# Patient Record
Sex: Female | Born: 1965 | Race: White | Hispanic: Yes | Marital: Single | Smoking: Former smoker
Health system: Southern US, Community
[De-identification: ages and names within clinical notes are randomized; demographics above are authoritative.]

## PROBLEM LIST (undated history)

## (undated) DIAGNOSIS — F419 Anxiety disorder, unspecified: Secondary | ICD-10-CM

## (undated) DIAGNOSIS — Z8659 Personal history of other mental and behavioral disorders: Secondary | ICD-10-CM

## (undated) DIAGNOSIS — R51 Headache: Secondary | ICD-10-CM

## (undated) DIAGNOSIS — G8929 Other chronic pain: Secondary | ICD-10-CM

## (undated) DIAGNOSIS — R519 Headache, unspecified: Secondary | ICD-10-CM

## (undated) DIAGNOSIS — J45909 Unspecified asthma, uncomplicated: Secondary | ICD-10-CM

## (undated) HISTORY — PX: ABLATION: SHX5711

## (undated) HISTORY — DX: Unspecified asthma, uncomplicated: J45.909

---

## 2004-10-03 ENCOUNTER — Emergency Department (HOSPITAL_COMMUNITY): Admission: EM | Admit: 2004-10-03 | Discharge: 2004-10-03 | Payer: Self-pay | Admitting: Emergency Medicine

## 2006-02-26 ENCOUNTER — Emergency Department (HOSPITAL_COMMUNITY): Admission: EM | Admit: 2006-02-26 | Discharge: 2006-02-26 | Payer: Self-pay | Admitting: *Deleted

## 2006-03-11 ENCOUNTER — Emergency Department (HOSPITAL_COMMUNITY): Admission: EM | Admit: 2006-03-11 | Discharge: 2006-03-11 | Payer: Self-pay | Admitting: Emergency Medicine

## 2015-03-17 ENCOUNTER — Emergency Department (HOSPITAL_COMMUNITY): Payer: Medicaid Other

## 2015-03-17 ENCOUNTER — Emergency Department (HOSPITAL_COMMUNITY)
Admission: EM | Admit: 2015-03-17 | Discharge: 2015-03-17 | Disposition: A | Discharge status: Home / Self Care | Payer: Medicaid Other | Attending: Emergency Medicine | Admitting: Emergency Medicine

## 2015-03-17 ENCOUNTER — Encounter (HOSPITAL_COMMUNITY): Payer: Self-pay | Admitting: *Deleted

## 2015-03-17 DIAGNOSIS — Z7951 Long term (current) use of inhaled steroids: Secondary | ICD-10-CM | POA: Insufficient documentation

## 2015-03-17 DIAGNOSIS — R05 Cough: Secondary | ICD-10-CM | POA: Diagnosis not present

## 2015-03-17 DIAGNOSIS — R079 Chest pain, unspecified: Secondary | ICD-10-CM

## 2015-03-17 DIAGNOSIS — R110 Nausea: Secondary | ICD-10-CM | POA: Diagnosis not present

## 2015-03-17 DIAGNOSIS — R0602 Shortness of breath: Secondary | ICD-10-CM | POA: Insufficient documentation

## 2015-03-17 DIAGNOSIS — G8929 Other chronic pain: Secondary | ICD-10-CM | POA: Insufficient documentation

## 2015-03-17 DIAGNOSIS — Z79899 Other long term (current) drug therapy: Secondary | ICD-10-CM | POA: Diagnosis not present

## 2015-03-17 DIAGNOSIS — F410 Panic disorder [episodic paroxysmal anxiety] without agoraphobia: Secondary | ICD-10-CM | POA: Insufficient documentation

## 2015-03-17 DIAGNOSIS — Z87891 Personal history of nicotine dependence: Secondary | ICD-10-CM | POA: Insufficient documentation

## 2015-03-17 HISTORY — DX: Personal history of other mental and behavioral disorders: Z86.59

## 2015-03-17 HISTORY — DX: Other chronic pain: G89.29

## 2015-03-17 HISTORY — DX: Headache, unspecified: R51.9

## 2015-03-17 HISTORY — DX: Headache: R51

## 2015-03-17 HISTORY — DX: Anxiety disorder, unspecified: F41.9

## 2015-03-17 LAB — BASIC METABOLIC PANEL
ANION GAP: 7 (ref 5–15)
BUN: 12 mg/dL (ref 6–20)
CALCIUM: 9.3 mg/dL (ref 8.9–10.3)
CO2: 25 mmol/L (ref 22–32)
Chloride: 108 mmol/L (ref 101–111)
Creatinine, Ser: 0.81 mg/dL (ref 0.44–1.00)
Glucose, Bld: 79 mg/dL (ref 65–99)
Potassium: 4.2 mmol/L (ref 3.5–5.1)
Sodium: 140 mmol/L (ref 135–145)

## 2015-03-17 LAB — CBC
HCT: 36 % (ref 36.0–46.0)
HEMOGLOBIN: 12.1 g/dL (ref 12.0–15.0)
MCH: 31.8 pg (ref 26.0–34.0)
MCHC: 33.6 g/dL (ref 30.0–36.0)
MCV: 94.7 fL (ref 78.0–100.0)
Platelets: 319 10*3/uL (ref 150–400)
RBC: 3.8 MIL/uL — AB (ref 3.87–5.11)
RDW: 12.4 % (ref 11.5–15.5)
WBC: 5.5 10*3/uL (ref 4.0–10.5)

## 2015-03-17 MED ORDER — ONDANSETRON 4 MG PO TBDP
4.0000 mg | ORAL_TABLET | Freq: Once | ORAL | Status: AC
  Administered 2015-03-17: 4 mg via ORAL
  Filled 2015-03-17: qty 1

## 2015-03-17 MED ORDER — ALBUTEROL SULFATE (2.5 MG/3ML) 0.083% IN NEBU
5.0000 mg | INHALATION_SOLUTION | Freq: Once | RESPIRATORY_TRACT | Status: AC
  Administered 2015-03-17: 5 mg via RESPIRATORY_TRACT
  Filled 2015-03-17: qty 6
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
## 2015-03-17 NOTE — ED Notes (Signed)
Patient transported to X-ray 

## 2015-03-17 NOTE — ED Notes (Signed)
Unable to get iSTAT Trop due patient resistance and anxiety. Attempted but unsuccessful will try again after NEB tx.
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
## 2015-06-08 ENCOUNTER — Encounter (HOSPITAL_COMMUNITY): Payer: Self-pay | Admitting: Emergency Medicine

## 2015-06-08 ENCOUNTER — Emergency Department (HOSPITAL_COMMUNITY)
Admission: EM | Admit: 2015-06-08 | Discharge: 2015-06-08 | Disposition: A | Discharge status: Home / Self Care | Payer: Medicaid Other | Attending: Emergency Medicine | Admitting: Emergency Medicine

## 2015-06-08 DIAGNOSIS — Z79899 Other long term (current) drug therapy: Secondary | ICD-10-CM | POA: Diagnosis not present

## 2015-06-08 DIAGNOSIS — G8929 Other chronic pain: Secondary | ICD-10-CM | POA: Insufficient documentation

## 2015-06-08 DIAGNOSIS — Z87891 Personal history of nicotine dependence: Secondary | ICD-10-CM | POA: Diagnosis not present

## 2015-06-08 DIAGNOSIS — M79604 Pain in right leg: Secondary | ICD-10-CM

## 2015-06-08 DIAGNOSIS — Z7951 Long term (current) use of inhaled steroids: Secondary | ICD-10-CM | POA: Insufficient documentation

## 2015-06-08 DIAGNOSIS — R200 Anesthesia of skin: Secondary | ICD-10-CM | POA: Diagnosis not present

## 2015-06-08 DIAGNOSIS — M79651 Pain in right thigh: Secondary | ICD-10-CM | POA: Insufficient documentation

## 2015-06-08 DIAGNOSIS — F410 Panic disorder [episodic paroxysmal anxiety] without agoraphobia: Secondary | ICD-10-CM | POA: Diagnosis not present

## 2015-06-08 DIAGNOSIS — M25552 Pain in left hip: Secondary | ICD-10-CM | POA: Insufficient documentation

## 2015-06-08 MED ORDER — KETOROLAC TROMETHAMINE 60 MG/2ML IM SOLN
60.0000 mg | Freq: Once | INTRAMUSCULAR | Status: AC
  Administered 2015-06-08: 60 mg via INTRAMUSCULAR
  Filled 2015-06-08: qty 2

## 2015-06-08 NOTE — Discharge Instructions (Signed)
Continue your home medications for your thigh pain. Schedule a follow up appointment with your PCP if symptoms persist.  Musculoskeletal Pain Musculoskeletal pain is muscle and boney aches and pains. These pains can occur in any part of the body. Your caregiver may treat you without knowing the cause of the pain. They may treat you if blood or urine tests, X-rays, and other tests were normal.  CAUSES There is often not a definite cause or reason for these pains. These pains may be caused by a type of germ (virus). The discomfort may also come from overuse. Overuse includes working out too hard when your body is not fit. Boney aches also come from weather changes. Bone is sensitive to atmospheric pressure changes. HOME CARE INSTRUCTIONS   Ask when your test results will be ready. Make sure you get your test results.  Only take over-the-counter or prescription medicines for pain, discomfort, or fever as directed by your caregiver. If you were given medications for your condition, do not drive, operate machinery or power tools, or sign legal documents for 24 hours. Do not drink alcohol. Do not take sleeping pills or other medications that may interfere with treatment.  Continue all activities unless the activities cause more pain. When the pain lessens, slowly resume normal activities. Gradually increase the intensity and duration of the activities or exercise.  During periods of severe pain, bed rest may be helpful. Lay or sit in any position that is comfortable.  Putting ice on the injured area.  Put ice in a bag.  Place a towel between your skin and the bag.  Leave the ice on for 15 to 20 minutes, 3 to 4 times a day.  Follow up with your caregiver for continued problems and no reason can be found for the pain. If the pain becomes worse or does not go away, it may be necessary to repeat tests or do additional testing. Your caregiver may need to look further for a possible cause. SEEK  IMMEDIATE MEDICAL CARE IF:  You have pain that is getting worse and is not relieved by medications.  You develop chest pain that is associated with shortness or breath, sweating, feeling sick to your stomach (nauseous), or throw up (vomit).  Your pain becomes localized to the abdomen.  You develop any new symptoms that seem different or that concern you. MAKE SURE YOU:   Understand these instructions.  Will watch your condition.  Will get help right away if you are not doing well or get worse.   This information is not intended to replace advice given to you by your health care provider. Make sure you discuss any questions you have with your health care provider.   Document Released: 06/06/2005 Document Revised: 08/29/2011 Document Reviewed: 02/08/2013 Elsevier Interactive Patient Education [employer]2016 [employer]

## 2015-06-08 NOTE — ED Provider Notes (Signed)
CSN: [id number]     Arrival date & time 06/08/15  0754 History   First MD Initiated Contact with Patient 06/08/15 [phone]     Chief Complaint  Patient presents with  . Groin Pain   HPI  [name] is a 49 year old female with PMHx of anxiety presenting with thigh pain. She reports that the pain has been present for the past 1-2 days. The pain is located over the anterior thigh from "right where my thigh hits my body to right above my knee". She describes it as an ache. The pain is constant and exacerbated by movement. She states that squatting and climbing stairs especially aggravates the pain. She has tried tylenol and muscle relaxers for her pain without significant relief. She reports a history of "leg problems" and states that she always has pains and occasional numbness in her feet. She states this pain is in a different location than her usual aches but is similar in presentation. She is followed by a PCP at [facility] for her MSK issues. She is prescribed meloxicam and cyclobenzaprine. She reports increased lifting recently from a move to a new house and reports being very active with her twin toddlers. She denies trauma or other injury to the thigh. She is able to ambulate but reports a limp "when the pain gets really bad". Denies weakness or numbness of the extremity, rash or color change of the extremity, pain in the calf or loss of sensation in the extremity.  Past Medical History  Diagnosis Date  . Anxiety disorder     by pt report  . Chronic headaches     by pt report  . History of panic attacks     by pt report   History reviewed. No pertinent past surgical history. No family history on file. Social History  Substance Use Topics  . Smoking status: Former Games developer  . Smokeless tobacco: None  . Alcohol Use: Yes     Comment: "every chance I get, I drink red wine, last was 2 wks ago"   OB History    No data available     Review of Systems    Musculoskeletal: Positive for myalgias. Negative for gait problem.  Skin: Negative for color change and rash.  Neurological: Negative for weakness and numbness.  All other systems reviewed and are negative.     Allergies  Pollen extract  Home Medications   Prior to Admission medications   Medication Sig Start Date End Date Taking? Authorizing Provider  budesonide (RHINOCORT AQUA) 32 MCG/ACT nasal spray Place 2 sprays into the nose daily. 10/09/14 10/09/15 Yes Historical Provider, MD  busPIRone (BUSPAR) 10 MG tablet Take 10 mg by mouth 2 (two) times daily.   Yes Historical Provider, MD  Butalbital-APAP-Caffeine 50-300-40 MG CAPS Take 1-2 tablets by mouth as needed (headaches).  05/22/14  Yes Historical Provider, MD  cetirizine (ZYRTEC) 10 MG tablet Take 10 mg by mouth daily.   Yes Historical Provider, MD  cyclobenzaprine (FLEXERIL) 10 MG tablet Take 10 mg by mouth 3 (three) times daily as needed for muscle spasms.   Yes Historical Provider, MD  EPIPEN 2-PAK 0.3 MG/0.3ML SOAJ injection USE FOR SEVERE ALLERGIC REACTION 01/12/15  Yes Historical Provider, MD  fexofenadine (ALLEGRA) 180 MG tablet Take 1 tablet by mouth daily.  01/12/15  Yes Historical Provider, MD  gabapentin (NEURONTIN) 300 MG capsule Take 300-600 mg by mouth daily as needed (pain).  08/11/14  Yes Historical Provider, MD  methocarbamol (ROBAXIN) 750 MG tablet Take 1 tablet by mouth 4 (four) times daily as needed for muscle spasms.  12/23/14  Yes Historical Provider, MD  montelukast (SINGULAIR) 10 MG tablet Take 10 mg by mouth daily. 12/23/14  Yes Historical Provider, MD  Multiple Vitamin (MULTIVITAMIN WITH MINERALS) TABS tablet Take 1 tablet by mouth daily.   Yes Historical Provider, MD  NASONEX 50 MCG/ACT nasal spray Place 1 spray into the nose 2 (two) times daily as needed (FOR STUFFY NOSE OR DRAINAGe).  01/12/15  Yes Historical Provider, MD  ranitidine (ZANTAC) 150 MG tablet Take 1 tablet by mouth 2 (two) times daily. 01/12/15  Yes  Historical Provider, MD   BP 126/78 mmHg  Pulse 93  Temp(Src) 99.2 F (37.3 C) (Oral)  Resp 18  SpO2 100% Physical Exam  Constitutional: She appears well-developed and well-nourished. No distress.  HENT:  Head: Normocephalic and atraumatic.  Eyes: Conjunctivae are normal. Right eye exhibits no discharge. Left eye exhibits no discharge. No scleral icterus.  Neck: Normal range of motion.  Cardiovascular: Normal rate, regular rhythm, normal heart sounds and intact distal pulses.   Pedal pulse palpable. Cap refill < 2 seconds  Pulmonary/Chest: Effort normal and breath sounds normal. No respiratory distress. She has no wheezes. She has no rales.  Musculoskeletal: Normal range of motion.       Left hip: She exhibits tenderness. She exhibits normal range of motion, normal strength, no swelling and no deformity.       Legs: Generalized mild tenderness over anterior thigh as demonstrated in diagram. FROM of the hip, knee and ankle maintained. No edema, induration or obvious deformity. No calf tenderness. Pt is able to ambulate though favoring the right leg.   Neurological: She is alert. Coordination normal.  5/5 strength of the BLE. Sensation to light touch intact throughout.   Skin: Skin is warm and dry.  No rash, erythema, ecchymosis or other skin changes noted over the right thigh  Psychiatric: She has a normal mood and affect. Her behavior is normal.  Nursing note and vitals reviewed.   ED Course  Procedures (including critical care time) Labs Review Labs Reviewed - No data to display  Imaging Review No results found. I have personally reviewed and evaluated these images and lab results as part of my medical decision-making.   EKG Interpretation None      MDM   Final diagnoses:  Right leg pain   Patient presenting with right anterior thigh pain without known injury. She reports a history of "leg problems" including chronic pains and numbness. Right leg is neurovascularly  intact with FROM. No imaging indicated at this time. Pain managed in ED with toradol. Pt is able to ambulate with a steady gait. Crutches given per pt request and conservative therapy recommended. Discussed RICE therapy and use of OTC pain relievers. Pt is to continue her home medications for pain relief as well. Pt advised to follow up with her PCP if symptoms persist. Return precautions discussed at bedside and given in discharge paperwork. Pt is stable for discharge.     Alveta HeimlichStevi Bronwyn Belasco, PA-C 06/08/15 [id number]  Rolland PorterMark James, MD 06/17/15 [phone]

## 2015-06-08 NOTE — ED Notes (Signed)
Per pt, states she has been doing a lot of lifting-states she woke up yesterday and left groin was painful-states also having some nasal congestion

## 2015-10-23 ENCOUNTER — Other Ambulatory Visit: Payer: Self-pay | Admitting: *Deleted

## 2015-10-23 MED ORDER — EPIPEN 2-PAK 0.3 MG/0.3ML IJ SOAJ: INTRAMUSCULAR | Status: AC

## 2016-03-14 ENCOUNTER — Encounter (HOSPITAL_COMMUNITY): Payer: Self-pay | Admitting: Emergency Medicine

## 2016-03-14 ENCOUNTER — Emergency Department (HOSPITAL_COMMUNITY)
Admission: EM | Admit: 2016-03-14 | Discharge: 2016-03-14 | Disposition: A | Discharge status: Home / Self Care | Payer: Medicaid Other | Attending: Emergency Medicine | Admitting: Emergency Medicine

## 2016-03-14 ENCOUNTER — Emergency Department (HOSPITAL_COMMUNITY): Payer: Medicaid Other

## 2016-03-14 DIAGNOSIS — Z79899 Other long term (current) drug therapy: Secondary | ICD-10-CM | POA: Diagnosis not present

## 2016-03-14 DIAGNOSIS — R102 Pelvic and perineal pain: Secondary | ICD-10-CM | POA: Diagnosis not present

## 2016-03-14 DIAGNOSIS — Z87891 Personal history of nicotine dependence: Secondary | ICD-10-CM | POA: Insufficient documentation

## 2016-03-14 DIAGNOSIS — R1031 Right lower quadrant pain: Secondary | ICD-10-CM | POA: Diagnosis present

## 2016-03-14 LAB — CBC WITH DIFFERENTIAL/PLATELET
BASOS ABS: 0.1 10*3/uL (ref 0.0–0.1)
BASOS PCT: 1 %
Eosinophils Absolute: 0.1 10*3/uL (ref 0.0–0.7)
Eosinophils Relative: 2 %
HEMATOCRIT: 38 % (ref 36.0–46.0)
HEMOGLOBIN: 13.1 g/dL (ref 12.0–15.0)
LYMPHS PCT: 31 %
Lymphs Abs: 2.3 10*3/uL (ref 0.7–4.0)
MCH: 31.6 pg (ref 26.0–34.0)
MCHC: 34.5 g/dL (ref 30.0–36.0)
MCV: 91.8 fL (ref 78.0–100.0)
MONO ABS: 0.6 10*3/uL (ref 0.1–1.0)
MONOS PCT: 8 %
NEUTROS ABS: 4.5 10*3/uL (ref 1.7–7.7)
NEUTROS PCT: 60 %
Platelets: 287 10*3/uL (ref 150–400)
RBC: 4.14 MIL/uL (ref 3.87–5.11)
RDW: 12.7 % (ref 11.5–15.5)
WBC: 7.6 10*3/uL (ref 4.0–10.5)

## 2016-03-14 LAB — BASIC METABOLIC PANEL
ANION GAP: 8 (ref 5–15)
BUN: 17 mg/dL (ref 6–20)
CALCIUM: 9.6 mg/dL (ref 8.9–10.3)
CHLORIDE: 104 mmol/L (ref 101–111)
CO2: 26 mmol/L (ref 22–32)
Creatinine, Ser: 0.89 mg/dL (ref 0.44–1.00)
GFR calc non Af Amer: >60 mL/min (ref >60)
GLUCOSE: 77 mg/dL (ref 65–99)
Potassium: 4.1 mmol/L (ref 3.5–5.1)
Sodium: 138 mmol/L (ref 135–145)

## 2016-03-14 LAB — URINALYSIS, ROUTINE W REFLEX MICROSCOPIC
Bilirubin Urine: NEGATIVE
GLUCOSE, UA: NEGATIVE mg/dL
Ketones, ur: NEGATIVE mg/dL
LEUKOCYTES UA: NEGATIVE
NITRITE: NEGATIVE
PH: 5 (ref 5.0–8.0)
Protein, ur: NEGATIVE mg/dL
SPECIFIC GRAVITY, URINE: 1.02 (ref 1.005–1.030)

## 2016-03-14 LAB — POC URINE PREG, ED: Preg Test, Ur: NEGATIVE

## 2016-03-14 LAB — WET PREP, GENITAL
Clue Cells Wet Prep HPF POC: NONE SEEN
SPERM: NONE SEEN
TRICH WET PREP: NONE SEEN
YEAST WET PREP: NONE SEEN

## 2016-03-14 LAB — URINE MICROSCOPIC-ADD ON

## 2016-03-14 MED ORDER — PSEUDOEPHEDRINE HCL 60 MG PO TABS
60.0000 mg | ORAL_TABLET | Freq: Once | ORAL | Status: AC
  Administered 2016-03-14: 60 mg via ORAL
  Filled 2016-03-14: qty 1

## 2016-03-14 MED ORDER — ALBUTEROL SULFATE HFA 108 (90 BASE) MCG/ACT IN AERS
2.0000 | INHALATION_SPRAY | Freq: Once | RESPIRATORY_TRACT | Status: AC
  Administered 2016-03-14: 2 via RESPIRATORY_TRACT
  Filled 2016-03-14: qty 6.7

## 2016-03-14 NOTE — ED Triage Notes (Signed)
Patient c/o RLQ pain that is intermittent since yesterday. Patient states pain is sharp and come on and last about 15 seconds.    Patient adds that she has had a cough and some SOB when lying down.

## 2016-03-14 NOTE — Discharge Instructions (Signed)
Take over the counter sudafed for congestion. Take zyrtec for allergies. Tylenol or motrin for pain. Follow up with your doctor. Return if worsening symptoms.

## 2016-03-14 NOTE — ED Notes (Signed)
Patient transported to Ultrasound 

## 2016-03-14 NOTE — ED Provider Notes (Signed)
[facility]-EMERGENCY DEPT Provider Note   CSN: [id number] Arrival date & time: 03/14/16  0747     History   Chief Complaint Chief Complaint  Patient presents with  . RLQ pain    HPI Sonya Mitchell is a 50 y.o. female.  HPI Sonya Mitchell is a 50 y.o. female with history of anxiety and chronic headaches, presents to emergency department complaining of lower abdominal pain. Patient states that her pain started yesterday. She states throughout the day yesterday and today she has had several episodes of sharp shooting pain in the right lower abdomen that comes and goes. Patient states pain lasts just a few seconds at a time. States it is sharp. She reports pain is worse when she is lying down flat. Also worsened by certain movements. States sometimes pain occurs even when she is not moving or doing anything. She denies any nausea or vomiting. No pain at this time. She denies any diarrhea. No fever or chills. No urinary symptoms. No vaginal discharge or bleeding. Denies pregnancy. Denies any prior history of similar pain. Denies injuries.  Past Medical History:  Diagnosis Date  . Anxiety disorder    by pt report  . Chronic headaches    by pt report  . History of panic attacks    by pt report    There are no active problems to display for this patient.   History reviewed. No pertinent surgical history.  OB History    No data available       Home Medications    Prior to Admission medications   Medication Sig Start Date End Date Taking? Authorizing Provider  budesonide (RHINOCORT AQUA) 32 MCG/ACT nasal spray Place 2 sprays into the nose daily. 10/09/14 10/09/15  Historical Provider, MD  busPIRone (BUSPAR) 10 MG tablet Take 10 mg by mouth 2 (two) times daily.    Historical Provider, MD  Butalbital-APAP-Caffeine 50-300-40 MG CAPS Take 1-2 tablets by mouth as needed (headaches).  05/22/14   Historical Provider, MD  cetirizine (ZYRTEC) 10 MG tablet Take 10 mg by mouth daily.    Historical  Provider, MD  EPIPEN 2-PAK 0.3 MG/0.3ML SOAJ injection USE FOR SEVERE ALLERGIC REACTION 10/23/15   Cristal Fordalph Carter Bobbitt, MD  fexofenadine (ALLEGRA) 180 MG tablet Take 1 tablet by mouth daily.  01/12/15   Historical Provider, MD  montelukast (SINGULAIR) 10 MG tablet Take 10 mg by mouth daily. 12/23/14   Historical Provider, MD  Multiple Vitamin (MULTIVITAMIN WITH MINERALS) TABS tablet Take 1 tablet by mouth daily.    Historical Provider, MD  NASONEX 50 MCG/ACT nasal spray Place 1 spray into the nose 2 (two) times daily as needed (FOR STUFFY NOSE OR DRAINAGe).  01/12/15   Historical Provider, MD  ranitidine (ZANTAC) 150 MG tablet Take 1 tablet by mouth 2 (two) times daily. 01/12/15   Historical Provider, MD    Family History No family history on file.  Social History Social History  Substance Use Topics  . Smoking status: Former Games developermoker  . Smokeless tobacco: Never Used  . Alcohol use Yes     Comment: "every chance I get, I drink red wine, last was 2 wks ago"     Allergies   Pollen extract   Review of Systems Review of Systems  Constitutional: Negative for chills and fever.  HENT: Positive for congestion.   Respiratory: Positive for cough and wheezing. Negative for chest tightness and shortness of breath.   Cardiovascular: Negative for chest pain, palpitations and leg swelling.  Gastrointestinal: Positive for abdominal pain. Negative for diarrhea, nausea and vomiting.  Genitourinary: Negative for dysuria, flank pain, pelvic pain, vaginal bleeding, vaginal discharge and vaginal pain.  Musculoskeletal: Negative for arthralgias, myalgias, neck pain and neck stiffness.  Skin: Negative for rash.  Neurological: Negative for dizziness, weakness and headaches.  All other systems reviewed and are negative.    Physical Exam Updated Vital Signs BP 135/93 (BP Location: Left Arm)   Pulse 94   Temp 98.4 F (36.9 C) (Oral)   Resp 18   Ht 5\' 6"  (1.676 m)   SpO2 100%   Physical Exam    Constitutional: She appears well-developed and well-nourished. No distress.  HENT:  Head: Normocephalic.  Right Ear: External ear normal.  Left Ear: External ear normal.  Nose: Nose normal.  Mouth/Throat: Oropharynx is clear and moist.  Eyes: Conjunctivae are normal.  Neck: Neck supple.  Cardiovascular: Normal rate, regular rhythm and normal heart sounds.   Pulmonary/Chest: Effort normal and breath sounds normal. No respiratory distress. She has no wheezes. She has no rales.  Abdominal: Soft. Bowel sounds are normal. She exhibits no distension. There is tenderness. There is no rebound.  Right lower quadrant tenderness. No guarding. No rebound tenderness. No CVA tenderness.  Genitourinary:  Genitourinary Comments: Normal external genitalia. Normal vaginal canal. Small thin white discharge. Cervix is normal, closed. No CMT. No uterine tenderness. Right adnexal tenderness. No masses palpated.    Musculoskeletal: She exhibits no edema.  Neurological: She is alert.  Skin: Skin is warm and dry.  Psychiatric: She has a normal mood and affect. Her behavior is normal.  Nursing note and vitals reviewed.    ED Treatments / Results  Labs (all labs ordered are listed, but only abnormal results are displayed) Labs Reviewed  WET PREP, GENITAL - Abnormal; Notable for the following:       Result Value   WBC, Wet Prep HPF POC FEW (*)    All other components within normal limits  URINALYSIS, ROUTINE W REFLEX MICROSCOPIC (NOT AT [facility]) - Abnormal; Notable for the following:    APPearance CLOUDY (*)    Hgb urine dipstick SMALL (*)    All other components within normal limits  URINE MICROSCOPIC-ADD ON - Abnormal; Notable for the following:    Squamous Epithelial / LPF 0-5 (*)    Bacteria, UA RARE (*)    All other components within normal limits  CBC WITH DIFFERENTIAL/PLATELET  BASIC METABOLIC PANEL  POC URINE PREG, ED  GC/CHLAMYDIA PROBE AMP ([place]) NOT AT [facility]    EKG  EKG  Interpretation None       Radiology No results found.  Procedures Procedures (including critical care time)  Medications Ordered in ED Medications - No data to display   Initial Impression / Assessment and Plan / ED Course  I have reviewed the triage vital signs and the nursing notes.  Pertinent labs & imaging results that were available during my care of the patient were reviewed by me and considered in my medical decision making (see chart for details).  Clinical Course  Patient with intermittent right lower quadrant pain that "shoots and lasts few seconds at a time." Patient does have mild right lower quadrant tenderness on exam. Pelvic exam shows right adnexal tenderness. Otherwise unremarkable. Will get ultrasound to rule out torsion. Labs, pregnancy test, urinalysis are pending as well.  10:44 AM Patient's labs are unremarkable, urinalysis is negative for infection. Pregnancy test is negative. Patient's ultrasound does not show any  findings to explain patient's pain. Results were discussed with patient. At this time she has no abdominal pain. Her pain is very atypical for appendicitis. I did discuss with her symptoms that should prompt her return back to the ED. Discussed still possibility of this being early appendicitis. She agrees to the plan of discharge home, she'll return if her symptoms are worsening. Tylenol or Motrin for pain at home. Patient is also complaining of nasal congestion and wheezing at night. Will give her an inhaler, and advised to take Zyrtec and Sudafed for her congestion.  Final Clinical Impressions(s) / ED Diagnoses   Final diagnoses:  Adnexal pain  Right lower quadrant abdominal pain    New Prescriptions New Prescriptions   No medications on file     Jaynie Crumble, PA-C 03/14/16 1050    Linwood Dibbles, MD 03/16/16 [phone]

## 2016-03-15 LAB — GC/CHLAMYDIA PROBE AMP (~~LOC~~) NOT AT ARMC
CHLAMYDIA, DNA PROBE: NEGATIVE
NEISSERIA GONORRHEA: NEGATIVE

## 2016-04-13 ENCOUNTER — Other Ambulatory Visit: Payer: Self-pay | Admitting: Allergy and Immunology

## 2016-04-15 ENCOUNTER — Other Ambulatory Visit: Payer: Self-pay

## 2016-04-20 ENCOUNTER — Ambulatory Visit: Payer: Medicaid Other | Admitting: Obstetrics and Gynecology

## 2016-04-20 ENCOUNTER — Encounter: Payer: Self-pay | Admitting: Obstetrics and Gynecology

## 2016-04-20 ENCOUNTER — Ambulatory Visit (INDEPENDENT_AMBULATORY_CARE_PROVIDER_SITE_OTHER): Payer: Medicaid Other | Admitting: Obstetrics and Gynecology

## 2016-04-20 DIAGNOSIS — R109 Unspecified abdominal pain: Secondary | ICD-10-CM | POA: Diagnosis not present

## 2016-04-20 NOTE — Progress Notes (Signed)
Patient is in the office for fibroid pain. Patient states that she was previously prescribed Bentyl and had an allergic reaction to it and was told to discontinue. Patient states that she wants a second opinion on her health status. Pt states that she has 2 cysts on her ovaries and one on her pancreas.

## 2016-04-20 NOTE — Progress Notes (Signed)
Pt presents for second opinion in regards to her abd pain.  She sees Dr. Val EagleNathaneil Canary' Keefe. She reports the onset of lower abd pain mid September. Describes as sharp/spams, moves from right to left,intensified with movement. She denies any N/V/F/C or illness exposures.  She denies any bowel or bladder dysfunction. But does report that meat, pork and chicken can cause a change in her bowel habits.  She had an U/S in the ER which revealed a small uterine fibroid and 2 simple appearing ovarian cysts.  She states that Dr. Rito Ehrlich'Keefe tried her on some Bentlyn but was unable to tolerate.  LMP 8 yrs ago prior to her uterine endometrial ablation.  She does not have any bleeding. Last pap was 3 yrs ago and reports as normal. Mammogram this year and normal per pt. She is not sexual active.   PE AF VSS  WDWN female in NAD Lungs clear Heart RRR Abd soft + BS non tender Pelvic nl EGBUS, cervix no lesions, scant white vaginal d/c, uterus small mobile non tender, no adnexal masses or tenderness.  A/P  Abd Pain  U/S findings reviewed with pt. Un able to identify a GYN source of her pain. I suspect this is GI related. Pt reports she has an appt with Gi. Pt reassured from a GYN standpoint. F/U with us PRN

## 2016-05-12 ENCOUNTER — Emergency Department (HOSPITAL_COMMUNITY): Payer: Medicaid Other

## 2016-05-12 ENCOUNTER — Encounter (HOSPITAL_COMMUNITY): Payer: Self-pay | Admitting: Emergency Medicine

## 2016-05-12 ENCOUNTER — Emergency Department (HOSPITAL_COMMUNITY)
Admission: EM | Admit: 2016-05-12 | Discharge: 2016-05-12 | Disposition: A | Discharge status: Home / Self Care | Payer: Medicaid Other | Attending: Emergency Medicine | Admitting: Emergency Medicine

## 2016-05-12 DIAGNOSIS — Z87891 Personal history of nicotine dependence: Secondary | ICD-10-CM | POA: Insufficient documentation

## 2016-05-12 DIAGNOSIS — R079 Chest pain, unspecified: Secondary | ICD-10-CM | POA: Diagnosis present

## 2016-05-12 DIAGNOSIS — R0789 Other chest pain: Secondary | ICD-10-CM | POA: Insufficient documentation

## 2016-05-12 DIAGNOSIS — J45909 Unspecified asthma, uncomplicated: Secondary | ICD-10-CM | POA: Diagnosis not present

## 2016-05-12 LAB — CBC
HCT: 36.7 % (ref 36.0–46.0)
HEMOGLOBIN: 12.3 g/dL (ref 12.0–15.0)
MCH: 31 pg (ref 26.0–34.0)
MCHC: 33.5 g/dL (ref 30.0–36.0)
MCV: 92.4 fL (ref 78.0–100.0)
PLATELETS: 309 10*3/uL (ref 150–400)
RBC: 3.97 MIL/uL (ref 3.87–5.11)
RDW: 12.3 % (ref 11.5–15.5)
WBC: 5.3 10*3/uL (ref 4.0–10.5)

## 2016-05-12 LAB — BASIC METABOLIC PANEL
ANION GAP: 9 (ref 5–15)
BUN: 11 mg/dL (ref 6–20)
CALCIUM: 9.2 mg/dL (ref 8.9–10.3)
CO2: 23 mmol/L (ref 22–32)
CREATININE: 0.98 mg/dL (ref 0.44–1.00)
Chloride: 103 mmol/L (ref 101–111)
GFR calc Af Amer: >60 mL/min (ref >60)
GLUCOSE: 78 mg/dL (ref 65–99)
Potassium: 3.8 mmol/L (ref 3.5–5.1)
Sodium: 135 mmol/L (ref 135–145)

## 2016-05-12 LAB — I-STAT TROPONIN, ED: TROPONIN I, POC: 0 ng/mL (ref 0.00–0.08)

## 2016-05-12 MED ORDER — NAPROXEN 500 MG PO TABS: 500.0000 mg | ORAL_TABLET | Freq: Two times a day (BID) | ORAL | 0 refills | Status: DC

## 2016-05-12 NOTE — ED Provider Notes (Signed)
[facility]-EMERGENCY DEPT Provider Note   CSN: [id number] Arrival date & time: 05/12/16  1050     History   Chief Complaint Chief Complaint  Patient presents with  . Chest Pain    HPI Sonya Mitchell is a 50 y.o. female.  Sonya Mitchell is a 50 y.o. Female who presents to the ED complaining of left-sided, nonradiating chest pain since 4 AM this morning. She reports her pain has been constant since 4 AM this morning. She reports her pain is worse with lifting of her left arm and touching the area. She denies any shortness of breath. She reports EMS came to her house this morning and patient decided to transport herself to the emergency department. She did take full-strength baby aspirin prior to arrival. She does complain of some anxiety associated with this today. She does report the pain seems different from her anxiety. She denies any burping or belching. No personal or close family history of MI, DVT or PE. No personal or close family history of clotting disorders such as factor V Leiden, protein C or protein S deficiency. She is not on endogenous estrogens. She is not a smoker and has no recent long travel history. She denies history of hyperlipidemia or hypertension. She denies fevers, cough, wheezing, shortness of breath, palpitations, nausea, vomiting, diarrhea, leg pain, leg swelling, numbness, tingling, weakness or rashes.   The history is provided by the patient. No language interpreter was used.  Chest Pain   Pertinent negatives include no abdominal pain, no back pain, no cough, no dizziness, no fever, no headaches, no nausea, no numbness, no palpitations, no shortness of breath, no vomiting and no weakness.    Past Medical History:  Diagnosis Date  . Anxiety disorder    by pt report  . Asthma   . Chronic headaches    by pt report  . History of panic attacks    by pt report    Patient Active Problem List   Diagnosis Date Noted  . Abdominal pain in female 04/20/2016    Past  Surgical History:  Procedure Laterality Date  . ABLATION      OB History    Gravida Para Term Preterm AB Living   6       2 4    SAB TAB Ectopic Multiple Live Births   1 1             Home Medications    Prior to Admission medications   Medication Sig Start Date End Date Taking? Authorizing Provider  budesonide (RHINOCORT AQUA) 32 MCG/ACT nasal spray Place 2 sprays into the nose daily. 10/09/14 10/09/15  Historical Provider, MD  busPIRone (BUSPAR) 10 MG tablet Take 10 mg by mouth 2 (two) times daily.    Historical Provider, MD  Butalbital-APAP-Caffeine 50-300-40 MG CAPS Take 1-2 tablets by mouth daily as needed (headaches).  05/22/14   Historical Provider, MD  cetirizine (ZYRTEC) 10 MG tablet Take 10 mg by mouth daily as needed for allergies.     Historical Provider, MD  EPIPEN 2-PAK 0.3 MG/0.3ML SOAJ injection USE FOR SEVERE ALLERGIC REACTION 10/23/15   Cristal Fordalph Carter Bobbitt, MD  fexofenadine (ALLEGRA) 180 MG tablet Take 1 tablet by mouth daily.  01/12/15   Historical Provider, MD  montelukast (SINGULAIR) 10 MG tablet Take 10 mg by mouth daily. 12/23/14   Historical Provider, MD  Multiple Vitamin (MULTIVITAMIN WITH MINERALS) TABS tablet Take 1 tablet by mouth daily.    Historical Provider, MD  Multiple  Vitamins-Minerals (HAIR SKIN AND NAILS FORMULA PO) Take 1 tablet by mouth daily.    Historical Provider, MD  naproxen (NAPROSYN) 500 MG tablet Take 1 tablet (500 mg total) by mouth 2 (two) times daily with a meal. 05/12/16   Everlene Farrier, PA-C  NASONEX 50 MCG/ACT nasal spray Place 1 spray into the nose 2 (two) times daily as needed (FOR STUFFY NOSE OR DRAINAGe).  01/12/15   Historical Provider, MD  ranitidine (ZANTAC) 150 MG tablet Take 1 tablet by mouth 2 (two) times daily. 01/12/15   Historical Provider, MD    Family History Family History  Problem Relation Age of Onset  . Diabetes Mother   . Asthma Mother   . Stroke Maternal Grandfather   . Asthma Son   . Seizures Daughter      Social History Social History  Substance Use Topics  . Smoking status: Former Games developer  . Smokeless tobacco: Never Used  . Alcohol use Yes     Comment: "every chance I get, I drink red wine, last was 2 wks ago"     Allergies   Bee venom and Pollen extract   Review of Systems Review of Systems  Constitutional: Negative for chills and fever.  HENT: Negative for congestion and sore throat.   Eyes: Negative for visual disturbance.  Respiratory: Negative for cough, shortness of breath and wheezing.   Cardiovascular: Positive for chest pain. Negative for palpitations and leg swelling.  Gastrointestinal: Negative for abdominal pain, nausea and vomiting.  Genitourinary: Negative for dysuria.  Musculoskeletal: Negative for back pain and neck pain.  Skin: Negative for rash.  Neurological: Negative for dizziness, syncope, weakness, numbness and headaches.     Physical Exam Updated Vital Signs BP 114/77   Pulse 81   Temp 97.7 F (36.5 C) (Oral)   Resp 16   SpO2 98%   Physical Exam  Constitutional: She appears well-developed and well-nourished. No distress.  Nontoxic appearing.  HENT:  Head: Normocephalic and atraumatic.  Mouth/Throat: Oropharynx is clear and moist.  Eyes: Conjunctivae are normal. Pupils are equal, round, and reactive to light. Right eye exhibits no discharge. Left eye exhibits no discharge.  Neck: Normal range of motion. Neck supple. No JVD present. No tracheal deviation present.  Cardiovascular: Normal rate, regular rhythm, normal heart sounds and intact distal pulses.  Exam reveals no gallop and no friction rub.   No murmur heard. Bilateral radial, posterior tibialis and dorsalis pedis pulses are intact.    Pulmonary/Chest: Effort normal and breath sounds normal. No stridor. No respiratory distress. She has no wheezes. She has no rales. She exhibits tenderness.  Lungs are clear to auscultation bilaterally. No increased work of breathing. Left substernal  chest wall is tender to palpation and reproduces her chest pain.  Abdominal: Soft. There is no tenderness. There is no guarding.  Abdomen soft and nontender to palpation.  Musculoskeletal: She exhibits no edema or tenderness.  No lower extremity edema or tenderness.  Lymphadenopathy:    She has no cervical adenopathy.  Neurological: She is alert. Coordination normal.  Normal gait.  Skin: Skin is warm and dry. Capillary refill takes less than 2 seconds. No rash noted. She is not diaphoretic. No erythema. No pallor.  Psychiatric: She has a normal mood and affect. Her behavior is normal.  Nursing note and vitals reviewed.    ED Treatments / Results  Labs (all labs ordered are listed, but only abnormal results are displayed) Labs Reviewed  BASIC METABOLIC PANEL  CBC  Rosezena SensorI-STAT TROPOININ, ED    EKG  EKG Interpretation  Date/Time:  Thursday May 12 2016 11:00:28 EST Ventricular Rate:  68 PR Interval:  152 QRS Duration: 86 QT Interval:  382 QTC Calculation: 406 R Axis:   82 Text Interpretation:  Normal sinus rhythm Normal ECG no significant change since Sept 2016 Confirmed by Criss AlvineGOLDSTON MD, SCOTT [phone]([id number]) on 05/12/2016 12:17:07 PM       Radiology Dg Chest 2 View  Result Date: 05/12/2016 CLINICAL DATA:  Patient reports low back pain this morning that has radiated to the left side of her chest, patient reports pain getting progressively worse. Patient reports taking Asprin but that has only helped a little. HX anxiety EXAM: CHEST  2 VIEW COMPARISON:  03/17/2015 FINDINGS: Normal mediastinum and cardiac silhouette. Normal pulmonary vasculature. No evidence of effusion, infiltrate, or pneumothorax. No acute bony abnormality. IMPRESSION: Normal chest radiograph Electronically Signed   By: Genevive BiStewart  Edmunds M.D.   On: 05/12/2016 11:31    Procedures Procedures (including critical care time)  Medications Ordered in ED Medications - No data to display   Initial Impression /  Assessment and Plan / ED Course  I have reviewed the triage vital signs and the nursing notes.  Pertinent labs & imaging results that were available during my care of the patient were reviewed by me and considered in my medical decision making (see chart for details).  Clinical Course    This is a 50 y.o. Female who presents to the ED complaining of left-sided, nonradiating chest pain since 4 AM this morning. She reports her pain has been constant since 4 AM this morning. She reports her pain is worse with lifting of her left arm and touching the area. She denies any shortness of breath. She reports EMS came to her house this morning and patient decided to transport herself to the emergency department. She did take full-strength baby aspirin prior to arrival. She does complain of some anxiety associated with this today. She does report the pain seems different from her anxiety. She denies any burping or belching. No personal or close family history of MI, DVT or PE.  Patient presented with chest pain to the ED. Patient is to be discharged with recommendation to follow up with PCP in regards to today's hospital visit. Chest pain is not likely of cardiac or pulmonary etiology due to presentation, perc negative, VSS, no tracheal deviation, no JVD or new murmur, RRR, breath sounds equal bilaterally, EKG without acute abnormalities, negative troponin, and negative CXR. I see no need for delta troponin as the patient's chest pain began more than 6 hours prior to lab draw.  Patient has been advised to return to the ED if chest pain becomes exertional, associated with diaphoresis or nausea, radiates to left jaw/arm, worsens or becomes concerning in any way. Patient appears reliable for follow up and is agreeable to discharge. Will start on naproxen for her pain. I advised the patient to follow-up with their primary care provider this week. I advised the patient to return to the emergency department with new or  worsening symptoms or new concerns. The patient verbalized understanding and agreement with plan.     Final Clinical Impressions(s) / ED Diagnoses   Final diagnoses:  Nonspecific chest pain    New Prescriptions Discharge Medication List as of 05/12/2016  1:58 PM    START taking these medications   Details  naproxen (NAPROSYN) 500 MG tablet Take 1 tablet (500 mg total) by mouth 2 (two)  times daily with a meal., Starting Thu 05/12/2016, Print         Everlene Farrier, PA-C 05/12/16 [id number]    Pricilla Loveless, MD 05/19/16 1544

## 2016-05-12 NOTE — ED Triage Notes (Signed)
Pt sts left sided CP that started in back and in worse with positioning

## 2016-12-25 ENCOUNTER — Other Ambulatory Visit: Payer: Self-pay | Admitting: Allergy and Immunology

## 2016-12-26 ENCOUNTER — Other Ambulatory Visit: Payer: Self-pay

## 2017-04-16 IMAGING — DX DG CHEST 2V
2 series · 2 of 2 positions shown · non-contrast
Comparison: 03/17/2015

CLINICAL DATA: Patient reports low back pain this morning that has
radiated to the left side of her chest, patient reports pain getting
progressively worse. Patient reports taking Asprin but that has only
helped a little. HX anxiety

EXAM:
CHEST  2 VIEW

[w chest pa]
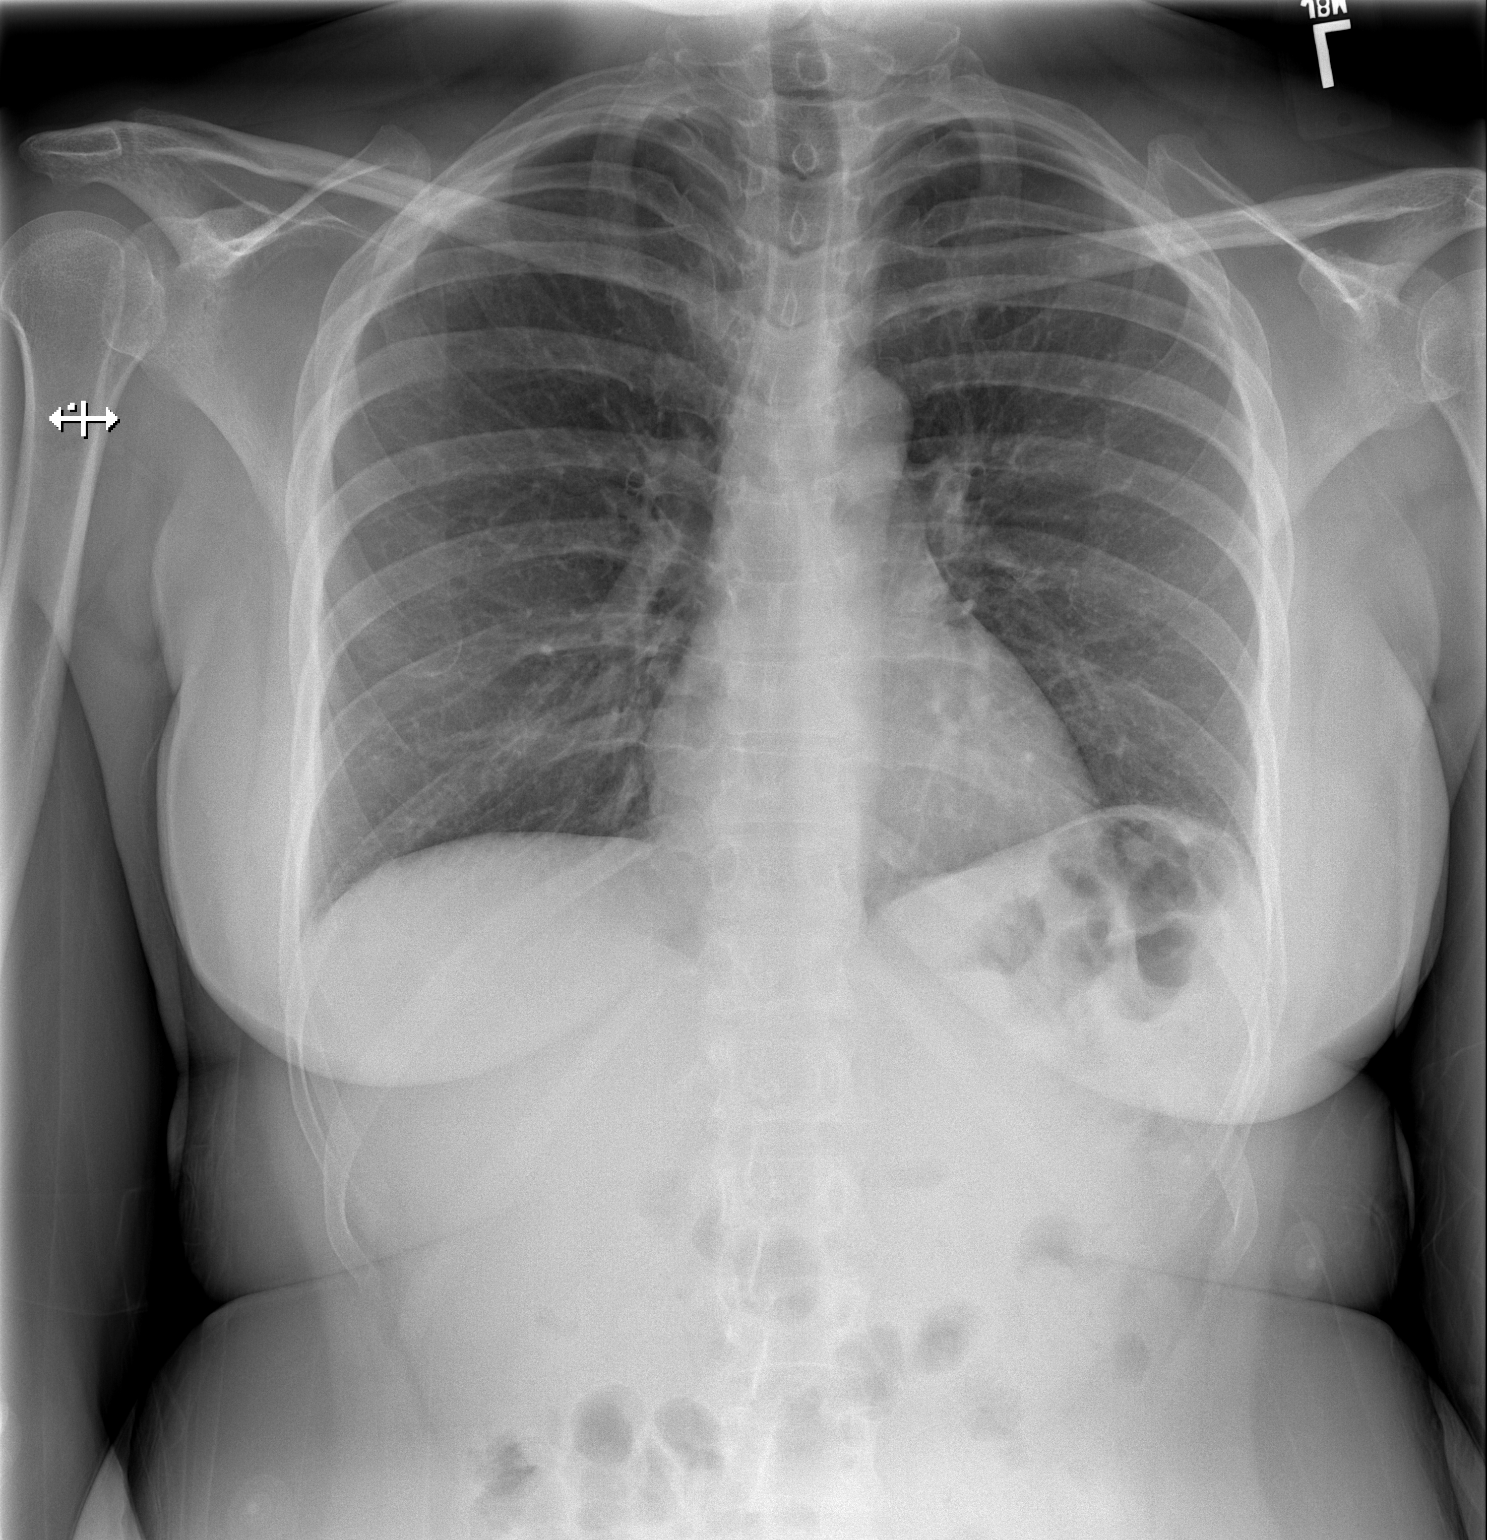

[w chest lat]
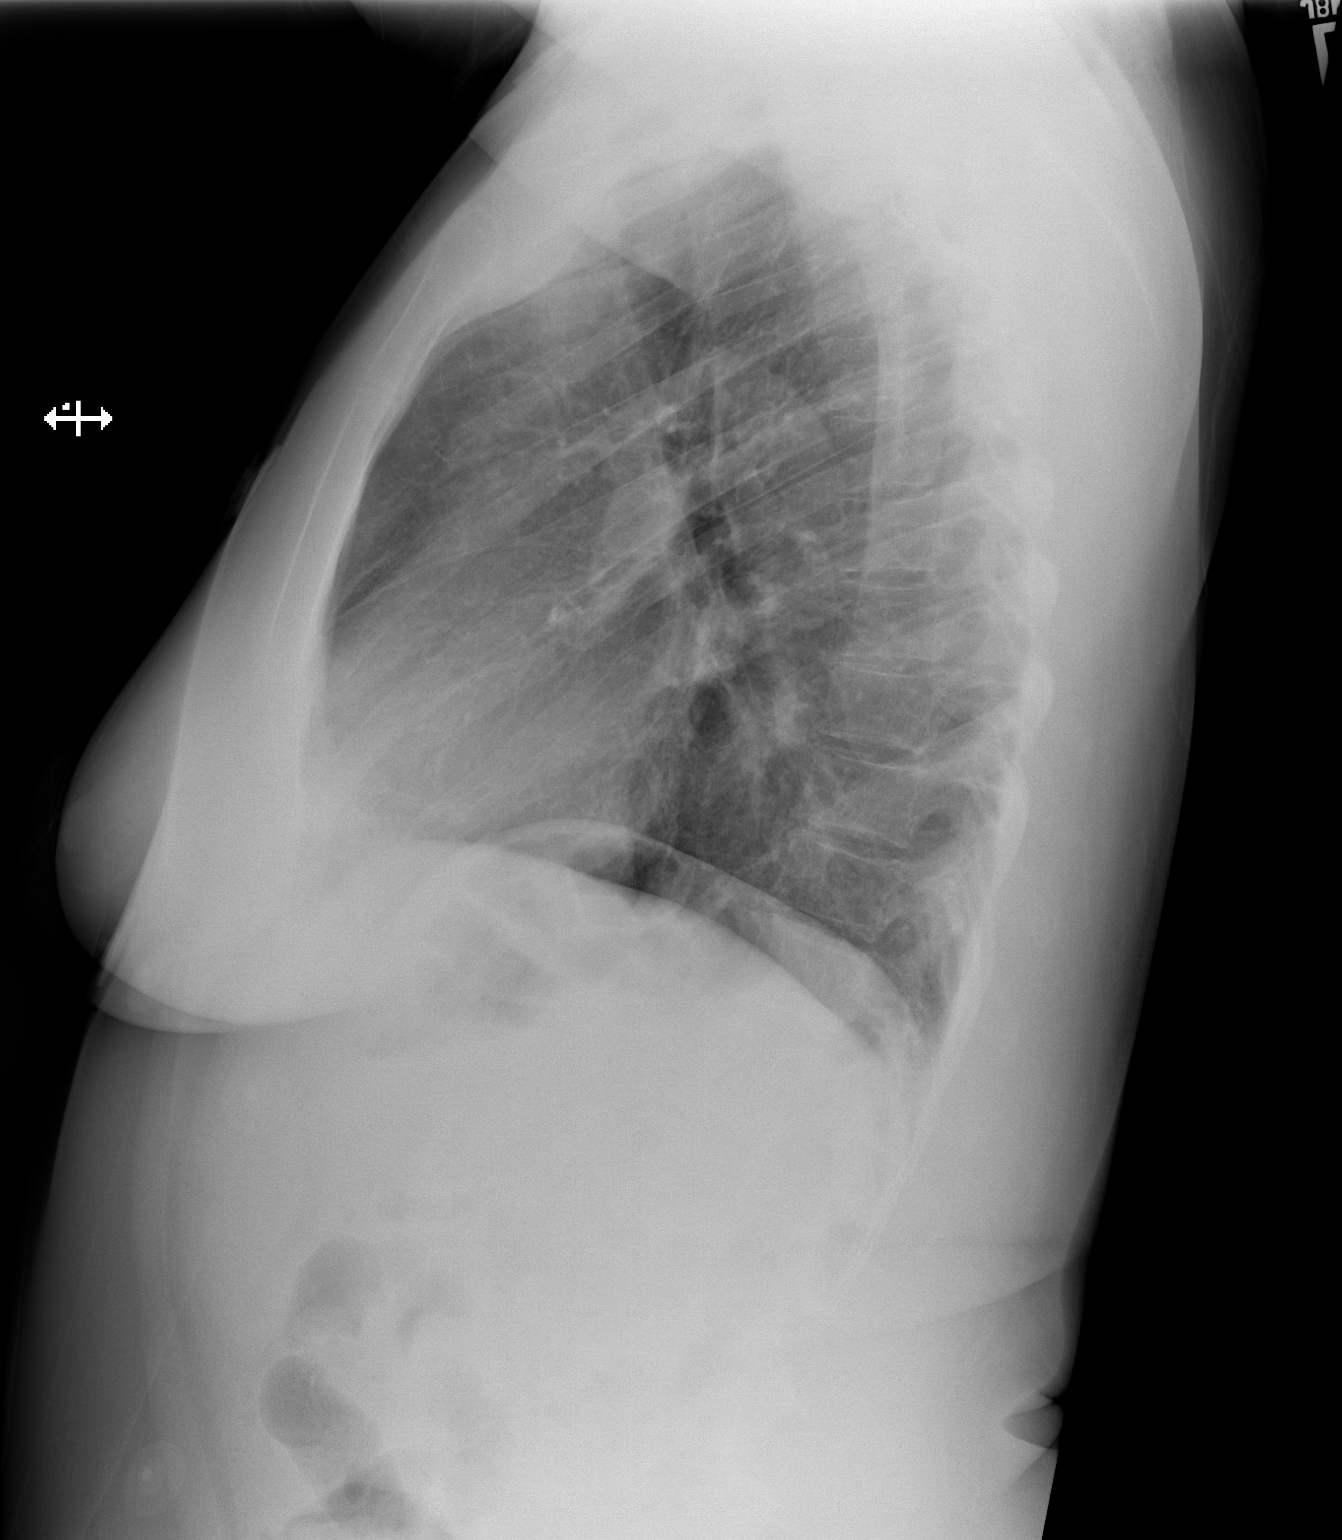

[2 of 2 positions shown; findings below may reference images not displayed]

FINDINGS: Normal mediastinum and cardiac silhouette. Normal pulmonary
vasculature. No evidence of effusion, infiltrate, or pneumothorax.
No acute bony abnormality.
IMPRESSION: Normal chest radiograph

## 2017-11-23 ENCOUNTER — Encounter (HOSPITAL_COMMUNITY): Payer: Self-pay | Admitting: Emergency Medicine

## 2017-11-23 ENCOUNTER — Emergency Department (HOSPITAL_COMMUNITY)
Admission: EM | Admit: 2017-11-23 | Discharge: 2017-11-23 | Discharge status: Against Medical Advice | Payer: Medicaid Other | Attending: Physician Assistant | Admitting: Physician Assistant

## 2017-11-23 ENCOUNTER — Emergency Department (HOSPITAL_COMMUNITY): Payer: Medicaid Other

## 2017-11-23 DIAGNOSIS — R079 Chest pain, unspecified: Secondary | ICD-10-CM

## 2017-11-23 DIAGNOSIS — Z79899 Other long term (current) drug therapy: Secondary | ICD-10-CM | POA: Insufficient documentation

## 2017-11-23 DIAGNOSIS — Z87891 Personal history of nicotine dependence: Secondary | ICD-10-CM | POA: Diagnosis not present

## 2017-11-23 DIAGNOSIS — Z7982 Long term (current) use of aspirin: Secondary | ICD-10-CM | POA: Diagnosis not present

## 2017-11-23 DIAGNOSIS — R0789 Other chest pain: Secondary | ICD-10-CM | POA: Diagnosis present

## 2017-11-23 DIAGNOSIS — J45909 Unspecified asthma, uncomplicated: Secondary | ICD-10-CM | POA: Insufficient documentation

## 2017-11-23 LAB — I-STAT TROPONIN, ED: Troponin i, poc: 0 ng/mL (ref 0.00–0.08)

## 2017-11-23 LAB — BASIC METABOLIC PANEL
ANION GAP: 7 (ref 5–15)
BUN: 15 mg/dL (ref 6–20)
CHLORIDE: 106 mmol/L (ref 101–111)
CO2: 28 mmol/L (ref 22–32)
Calcium: 9.2 mg/dL (ref 8.9–10.3)
Creatinine, Ser: 0.92 mg/dL (ref 0.44–1.00)
GFR calc Af Amer: >60 mL/min (ref >60)
GFR calc non Af Amer: >60 mL/min (ref >60)
GLUCOSE: 113 mg/dL — AB (ref 65–99)
POTASSIUM: 4.1 mmol/L (ref 3.5–5.1)
Sodium: 141 mmol/L (ref 135–145)

## 2017-11-23 LAB — D-DIMER, QUANTITATIVE (NOT AT ARMC)

## 2017-11-23 LAB — CBC
HCT: 33.7 % — ABNORMAL LOW (ref 36.0–46.0)
HEMOGLOBIN: 11.1 g/dL — AB (ref 12.0–15.0)
MCH: 31.4 pg (ref 26.0–34.0)
MCHC: 32.9 g/dL (ref 30.0–36.0)
MCV: 95.5 fL (ref 78.0–100.0)
Platelets: 297 10*3/uL (ref 150–400)
RBC: 3.53 MIL/uL — ABNORMAL LOW (ref 3.87–5.11)
RDW: 12.5 % (ref 11.5–15.5)
WBC: 5.9 10*3/uL (ref 4.0–10.5)

## 2017-11-23 LAB — I-STAT BETA HCG BLOOD, ED (MC, WL, AP ONLY): I-stat hCG, quantitative: <5 m[IU]/mL (ref <5)

## 2017-11-23 MED ORDER — ACETAMINOPHEN 500 MG PO TABS
1000.0000 mg | ORAL_TABLET | Freq: Once | ORAL | Status: AC
  Administered 2017-11-23: 1000 mg via ORAL
  Filled 2017-11-23: qty 2

## 2017-11-23 NOTE — ED Notes (Signed)
PA notified

## 2017-11-23 NOTE — ED Provider Notes (Signed)
[facility]-EMERGENCY DEPT Provider Note   CSN: [id number] Arrival date & time: 11/23/17  1027     History   Chief Complaint Chief Complaint  Patient presents with  . Chest Pain    HPI Sonya Mitchell is a 52 y.o. female with a past medical history of anxiety, asthma, panic attacks who presents today for evaluation of left-sided chest tightness.  She describes it as a pulling/tugging feeling when she moves.  She reports that it hurts the most when she lies flat on her back.  She does not recall any specific injury.  She reports that normally she has this pain when she is about to have a panic attack.  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  No shortness of breath, cough, abdominal pain.  Her pain is on her left anterior chest, does not radiate or move.  Is made worse with motion, made better with being still.  HPI  Past Medical History:  Diagnosis Date  . Anxiety disorder    by pt report  . Asthma   . Chronic headaches    by pt report  . History of panic attacks    by pt report    Patient Active Problem List   Diagnosis Date Noted  . Abdominal pain in female 04/20/2016    Past Surgical History:  Procedure Laterality Date  . ABLATION       OB History    Gravida  6   Para      Term      Preterm      AB  2   Living  4     SAB  1   TAB  1   Ectopic      Multiple      Live Births               Home Medications    Prior to Admission medications   Medication Sig Start Date End Date Taking? Authorizing Provider  albuterol (PROVENTIL HFA;VENTOLIN HFA) 108 (90 Base) MCG/ACT inhaler Inhale 2 puffs into the lungs every 6 (six) hours as needed for wheezing or shortness of breath.   Yes [provider]  aspirin 81 MG tablet Take 81 mg by mouth daily.   Yes  [provider]  beclomethasone (QVAR) 80 MCG/ACT inhaler Inhale 2 puffs into the lungs 2 (two) times daily.   Yes [provider]  budesonide (RHINOCORT ALLERGY) 32 MCG/ACT nasal spray Place 1 spray into both nostrils daily.   Yes [provider]  Butalbital-APAP-Caffeine 50-300-40 MG CAPS Take 1-2 tablets by mouth daily as needed (headaches).  05/22/14  Yes [provider]  cetirizine (ZYRTEC) 10 MG tablet Take 10 mg by mouth daily as needed for allergies.    Yes [provider]  EPIPEN 2-PAK 0.3 MG/0.3ML SOAJ injection USE FOR SEVERE ALLERGIC REACTION 10/23/15  Yes Bobbitt, Heywood Iles, MD  fluticasone [facility]) 50 MCG/ACT nasal spray Place 1 spray into both nostrils daily. 10/17/17  Yes [provider]  montelukast (SINGULAIR) 10 MG tablet Take 10 mg by mouth daily. 12/23/14  Yes [provider]  Multiple Vitamins-Minerals (HAIR SKIN AND NAILS FORMULA PO) Take 1 tablet by mouth daily.   Yes [provider]  naproxen (NAPROSYN) 500 MG tablet Take 1 tablet (500 mg  total) by mouth 2 (two) times daily with a meal. Patient not taking: Reported on 11/23/2017 05/12/16   Everlene Farrieransie, William, PA-C    Family History Family History  Problem Relation Age of Onset  . Diabetes Mother   . Asthma Mother   . Stroke Maternal Grandfather   . Asthma Son   . Seizures Daughter     Social History Social History   Tobacco Use  . Smoking status: Former Games developermoker  . Smokeless tobacco: Never Used  Substance Use Topics  . Alcohol use: Yes    Comment: "every chance I get, I drink red wine, last was 2 wks ago"  . Drug use: No     Allergies   Bee venom; Dicyclomine; and Pollen extract   Review of Systems Review of Systems  Constitutional: Negative for chills and fever.  HENT: Negative for ear pain and sore throat.   Eyes: Negative for pain and visual disturbance.  Respiratory: Negative for cough and shortness of breath.   Cardiovascular:  Positive for chest pain. Negative for palpitations and leg swelling.  Gastrointestinal: Negative for abdominal pain, nausea and vomiting.  Genitourinary: Negative for dysuria and hematuria.  Musculoskeletal: Negative for arthralgias and back pain.  Skin: Negative for color change and rash.  Neurological: Negative for seizures and syncope.  All other systems reviewed and are negative.    Physical Exam Updated Vital Signs BP 118/72 (BP Location: Left Arm)   Pulse 88   Temp 98.6 F (37 C) (Oral)   Resp 16   Ht 5\' 6"  (1.676 m)   Wt 95.3 kg (210 lb)   SpO2 100%   BMI 33.89 kg/m   Physical Exam  Constitutional: She appears well-developed and well-nourished. No distress.  HENT:  Head: Normocephalic and atraumatic.  Eyes: Conjunctivae are normal.  Neck: Neck supple.  Cardiovascular: Normal rate, regular rhythm and normal pulses.  No murmur heard. Pulses:      Dorsalis pedis pulses are 2+ on the right side, and 2+ on the left side.       Posterior tibial pulses are 2+ on the right side, and 2+ on the left side.  Pulmonary/Chest: Effort normal and breath sounds normal. No respiratory distress. She has no decreased breath sounds. She has no wheezes. She has no rhonchi. She has no rales.  Left anterior chest is generally tender to palpation.  Palpation over this area both re-creates and exacerbates her recorded pain.  Her pain is worsened with motion of the left shoulder.  Abdominal: Soft. There is no tenderness.  Musculoskeletal: She exhibits no edema.  Neurological: She is alert.  Skin: Skin is warm and dry.  Psychiatric: She has a normal mood and affect.  Nursing note and vitals reviewed.    ED Treatments / Results  Labs (all labs ordered are listed, but only abnormal results are displayed) Labs Reviewed  BASIC METABOLIC PANEL - Abnormal; Notable for the following components:      Result Value   Glucose, Bld 113 (*)    All other components within normal limits  CBC -  Abnormal; Notable for the following components:   RBC 3.53 (*)    Hemoglobin 11.1 (*)    HCT 33.7 (*)    All other components within normal limits  D-DIMER, QUANTITATIVE (NOT AT [facility])  I-STAT TROPONIN, ED  I-STAT BETA HCG BLOOD, ED ([facility], [facility], [facility] ONLY)    EKG EKG Interpretation  Date/Time:  Thursday November 23 2017 07:44:01 EDT Ventricular Rate:  83 PR  Interval:    QRS Duration: 70 QT Interval:  358 QTC Calculation: 421 R Axis:   81 Text Interpretation:  Sinus rhythm Confirmed by Bethann Berkshire [phone]) on 11/24/2017 12:50:23 PM   Radiology Dg Chest 2 View  Result Date: 11/23/2017 CLINICAL DATA:  Chest pain EXAM: CHEST - 2 VIEW COMPARISON:  05/12/2016 chest radiograph. FINDINGS: Stable cardiomediastinal silhouette with normal heart size. No pneumothorax. No pleural effusion. Lungs appear clear, with no acute consolidative airspace disease and no pulmonary edema. IMPRESSION: No active cardiopulmonary disease. Electronically Signed   By: Delbert Phenix M.D.   On: 11/23/2017 08:29    Procedures Procedures (including critical care time)  Medications Ordered in ED Medications  acetaminophen (TYLENOL) tablet 1,000 mg (1,000 mg Oral Given 11/23/17 0929)     Initial Impression / Assessment and Plan / ED Course  I have reviewed the triage vital signs and the nursing notes.  Pertinent labs & imaging results that were available during my care of the patient were reviewed by me and considered in my medical decision making (see chart for details).  Clinical Course as of Nov 25 1715  Thu Nov 23, 2017  0951 Was informed that patient was having tingling in her left hand.  On evaluation patient reports consistent with her normal carpal tunnel flair.  Has cortisone shot scheduled for tomorrow.    [EH]  1242 Informed that patient walked out   [EH]    Clinical Course User Index [EH] Cristina Gong, PA-C   Patient presents today for evaluation of chest pain and concern for impending panic  attack.  EKG was obtained without acute abnormalities, initial troponin normal.  D-dimer is not elevated.  Patient is mildly anemic which she states is her baseline.  X-ray was obtained and reviewed without acute abnormalities.  Before repeat troponin on evaluation patient walked out without notifying staff.  Final Clinical Impressions(s) / ED Diagnoses   Final diagnoses:  Chest pain, unspecified type    ED Discharge Orders    None       Cristina Gong, Cordelia Poche 11/24/17 1719    Abelino Derrick, MD 11/24/17 2015

## 2017-11-23 NOTE — ED Notes (Signed)
Patient left AMA -states she has to go get her children

## 2017-11-23 NOTE — ED Notes (Signed)
UNSUCCESSFUL LAB COLLECTION ATTEMPT 

## 2017-11-23 NOTE — ED Triage Notes (Signed)
Pt reports since last Friday she will have let side chest tightness, pulling sensation, when laying down, sitting up or ambulating and pains are getting worse. Reports pains usually happen when about to have to have panic attack.

## 2018-04-05 ENCOUNTER — Emergency Department (HOSPITAL_COMMUNITY)
Admission: EM | Admit: 2018-04-05 | Discharge: 2018-04-05 | Disposition: A | Discharge status: Home / Self Care | Payer: Medicaid Other | Attending: Emergency Medicine | Admitting: Emergency Medicine

## 2018-04-05 ENCOUNTER — Other Ambulatory Visit: Payer: Self-pay

## 2018-04-05 ENCOUNTER — Encounter (HOSPITAL_COMMUNITY): Payer: Self-pay | Admitting: Emergency Medicine

## 2018-04-05 DIAGNOSIS — Y92410 Unspecified street and highway as the place of occurrence of the external cause: Secondary | ICD-10-CM | POA: Diagnosis not present

## 2018-04-05 DIAGNOSIS — S39012A Strain of muscle, fascia and tendon of lower back, initial encounter: Secondary | ICD-10-CM | POA: Diagnosis not present

## 2018-04-05 DIAGNOSIS — Y939 Activity, unspecified: Secondary | ICD-10-CM | POA: Insufficient documentation

## 2018-04-05 DIAGNOSIS — Y998 Other external cause status: Secondary | ICD-10-CM | POA: Insufficient documentation

## 2018-04-05 DIAGNOSIS — S3992XA Unspecified injury of lower back, initial encounter: Secondary | ICD-10-CM | POA: Diagnosis present

## 2018-04-05 MED ORDER — PREDNISONE 20 MG PO TABS: 40.0000 mg | ORAL_TABLET | Freq: Every day | ORAL | 0 refills | Status: AC

## 2018-04-05 MED ORDER — PREDNISONE 20 MG PO TABS
60.0000 mg | ORAL_TABLET | ORAL | Status: AC
  Administered 2018-04-05: 60 mg via ORAL
  Filled 2018-04-05: qty 3

## 2018-04-05 MED ORDER — DICLOFENAC EPOLAMINE 1.3 % TD PTCH: 1.0000 | MEDICATED_PATCH | Freq: Two times a day (BID) | TRANSDERMAL | 1 refills | Status: DC
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
## 2018-04-05 NOTE — Discharge Instructions (Signed)
As discussed, your evaluation today has been largely reassuring.  But, it is important that you monitor your condition carefully, and do not hesitate to return to the ED if you develop new, or concerning changes in your condition.  Otherwise, please follow-up with your physician for appropriate ongoing care.   If your insurance coverage does not pay for the prescribed medications, please use over-the-counter patches including the ingredient dimethyl salicylate as a substitute.

## 2018-05-09 ENCOUNTER — Encounter: Payer: Self-pay | Admitting: Pulmonary Disease

## 2018-05-09 ENCOUNTER — Other Ambulatory Visit: Payer: Self-pay | Admitting: Pulmonary Disease

## 2018-05-09 ENCOUNTER — Ambulatory Visit: Payer: Medicaid Other | Admitting: Pulmonary Disease

## 2018-05-09 VITALS — BP 126/78 | HR 104 | Ht 66.0 in | Wt 192.0 lb

## 2018-05-09 DIAGNOSIS — R0681 Apnea, not elsewhere classified: Secondary | ICD-10-CM

## 2018-05-09 DIAGNOSIS — G478 Other sleep disorders: Secondary | ICD-10-CM | POA: Diagnosis not present

## 2018-05-09 DIAGNOSIS — G4719 Other hypersomnia: Secondary | ICD-10-CM

## 2018-05-09 NOTE — Progress Notes (Addendum)
Sonya Mitchell    [id number]    Mar 16, 1966  Primary Care Physician:Patient, No Pcp Per  Referring Physician: Verlon Au, MD [address] [place], [place] [address]  Chief complaint:   Patient with a history of nonrestorative sleep, witnessed apneas, history of snoring  HPI:  Has had problems with a sleep for many years Feels nonrestorative in the morning History of snoring, history of witnessed apneas  Comorbidities of mood disorder, panic attacks  Mother with obstructive sleep apnea  Smoked as a teenager  Usually goes to bed between 7 and 8:30 PM Wakes up 2-3 times during the night Final awakening about 4:30 in the morning  Some memory issues-short-term memory  She does have a history of asthma which is very well controlled Has a lot of musculoskeletal pains and discomfort  Outpatient Encounter Medications as of 05/09/2018  Medication Sig  . albuterol (PROVENTIL HFA;VENTOLIN HFA) 108 (90 Base) MCG/ACT inhaler Inhale 2 puffs into the lungs every 6 (six) hours as needed for wheezing or shortness of breath.  Marland Kitchen aspirin 81 MG tablet Take 81 mg by mouth daily.  . beclomethasone (QVAR) 80 MCG/ACT inhaler Inhale 2 puffs into the lungs 2 (two) times daily.  . budesonide (RHINOCORT ALLERGY) 32 MCG/ACT nasal spray Place 1 spray into both nostrils daily.  . Butalbital-APAP-Caffeine 50-300-40 MG CAPS Take 1-2 tablets by mouth daily as needed (headaches).   . cetirizine (ZYRTEC) 10 MG tablet Take 10 mg by mouth daily as needed for allergies.   Marland Kitchen EPIPEN 2-PAK 0.3 MG/0.3ML SOAJ injection USE FOR SEVERE ALLERGIC REACTION  . fluticasone (FLONASE) 50 MCG/ACT nasal spray Place 1 spray into both nostrils daily.  . montelukast (SINGULAIR) 10 MG tablet Take 10 mg by mouth daily.  . Multiple Vitamins-Minerals (HAIR SKIN AND NAILS FORMULA PO) Take 1 tablet by mouth daily.  . naproxen (NAPROSYN) 500 MG tablet Take 1 tablet (500 mg total) by mouth 2 (two) times  daily with a meal.  . predniSONE (DELTASONE) 20 MG tablet Take 2 tablets (40 mg total) by mouth daily with breakfast. For the next four days  . [DISCONTINUED] diclofenac (FLECTOR) 1.3 % PTCH Place 1 patch onto the skin 2 (two) times daily.   No facility-administered encounter medications on file as of 05/09/2018.     Allergies as of 05/09/2018 - Review Complete 05/09/2018  Allergen Reaction Noted  . Bee venom Shortness Of Breath and Swelling 03/14/2016  . Dicyclomine Shortness Of Breath 04/22/2016  . Pollen extract  03/17/2015    Past Medical History:  Diagnosis Date  . Anxiety disorder    by pt report  . Asthma   . Chronic headaches    by pt report  . History of panic attacks    by pt report    Past Surgical History:  Procedure Laterality Date  . ABLATION      Family History  Problem Relation Age of Onset  . Diabetes Mother   . Asthma Mother   . Stroke Maternal Grandfather   . Asthma Son   . Seizures Daughter     Social History   Socioeconomic History  . Marital status: Single    Spouse name: Not on file  . Number of children: Not on file  . Years of education: Not on file  . Highest education level: Not on file  Occupational History  . Not on file  Social Needs  . Financial resource strain: Not  on file  . Food insecurity:    Worry: Not on file    Inability: Not on file  . Transportation needs:    Medical: Not on file    Non-medical: Not on file  Tobacco Use  . Smoking status: Former Games developermoker  . Smokeless tobacco: Never Used  Substance and Sexual Activity  . Alcohol use: Yes    Comment: "every chance I get, I drink red wine, last was 2 wks ago"  . Drug use: No  . Sexual activity: Never    Birth control/protection: Abstinence  Lifestyle  . Physical activity:    Days per week: Not on file    Minutes per session: Not on file  . Stress: Not on file  Relationships  . Social connections:    Talks on phone: Not on file    Gets together: Not on file     Attends religious service: Not on file    Active member of club or organization: Not on file    Attends meetings of clubs or organizations: Not on file    Relationship status: Not on file  . Intimate partner violence:    Fear of current or ex partner: Not on file    Emotionally abused: Not on file    Physically abused: Not on file    Forced sexual activity: Not on file  Other Topics Concern  . Not on file  Social History Narrative  . Not on file    Review of Systems  Constitutional: Positive for appetite change.  Respiratory: Positive for cough and shortness of breath.   Cardiovascular: Positive for chest pain and leg swelling.  Musculoskeletal: Positive for arthralgias.  Psychiatric/Behavioral: Positive for sleep disturbance. The patient is nervous/anxious.   All other systems reviewed and are negative.   Vitals:   05/09/18 0905  BP: 126/78  Pulse: (!) 104  SpO2: 98%   ESS 12  Physical Exam  Constitutional: She appears well-developed and well-nourished.  HENT:  Head: Normocephalic and atraumatic.  Mallampati 2  Eyes: Pupils are equal, round, and reactive to light. Right eye exhibits no discharge. Left eye exhibits no discharge.  Neck: Normal range of motion. Neck supple. No tracheal deviation present. No thyromegaly present.  Cardiovascular: Normal rate and regular rhythm.  Pulmonary/Chest: Effort normal and breath sounds normal. No respiratory distress. She has no wheezes.  Abdominal: Soft. Bowel sounds are normal. She exhibits no distension. There is no tenderness.  Musculoskeletal: Normal range of motion. She exhibits no edema.   Data Reviewed: Chest x-ray on 11/23/2017 reviewed by myself showing no acute infiltrate  Assessment:  Moderate to high probability of significant sleep disordered breathing based on history of witnessed apneas, significant snoring  Nonrestorative sleep   Plan/Recommendations: Pathophysiology of sleep disordered breathing discussed  with the patient  Treatment options of sleep disordered breathing discussed with patient  Importance of exercise weight loss discussed  Initiation/optimization of medications for anxiety may help  I will see her back in the office in about 3 months Encouraged to call if any significant concerns     Virl DiamondAdewale Olalere MD [facility] 05/09/2018, 9:11 AM  CC: Verlon AuBoyd, Tammy Lamonica, MD

## 2018-05-09 NOTE — Patient Instructions (Signed)
Patient with snoring, witnessed apneas nonrestorative sleep  Moderate to high probability of significant sleep disordered breathing  We will order a home sleep study  I will see you back in the office in about 3 months  Call with any significant concerns Sleep Apnea Sleep apnea is a condition in which breathing pauses or becomes shallow during sleep. Episodes of sleep apnea usually last 10 seconds or longer, and they may occur as many as 20 times an hour. Sleep apnea disrupts your sleep and keeps your body from getting the rest that it needs. This condition can increase your risk of certain health problems, including:  Heart attack.  Stroke.  Obesity.  Diabetes.  Heart failure.  Irregular heartbeat.  There are three kinds of sleep apnea:  Obstructive sleep apnea. This kind is caused by a blocked or collapsed airway.  Central sleep apnea. This kind happens when the part of the brain that controls breathing does not send the correct signals to the muscles that control breathing.  Mixed sleep apnea. This is a combination of obstructive and central sleep apnea.  What are the causes? The most common cause of this condition is a collapsed or blocked airway. An airway can collapse or become blocked if:  Your throat muscles are abnormally relaxed.  Your tongue and tonsils are larger than normal.  You are overweight.  Your airway is smaller than normal.  What increases the risk? This condition is more likely to develop in people who:  Are overweight.  Smoke.  Have a smaller than normal airway.  Are elderly.  Are female.  Drink alcohol.  Take sedatives or tranquilizers.  Have a family history of sleep apnea.  What are the signs or symptoms? Symptoms of this condition include:  Trouble staying asleep.  Daytime sleepiness and tiredness.  Irritability.  Loud snoring.  Morning headaches.  Trouble concentrating.  Forgetfulness.  Decreased interest in  sex.  Unexplained sleepiness.  Mood swings.  Personality changes.  Feelings of depression.  Waking up often during the night to urinate.  Dry mouth.  Sore throat.  How is this diagnosed? This condition may be diagnosed with:  A medical history.  A physical exam.  A series of tests that are done while you are sleeping (sleep study). These tests are usually done in a sleep lab, but they may also be done at home.  How is this treated? Treatment for this condition aims to restore normal breathing and to ease symptoms during sleep. It may involve managing health issues that can affect breathing, such as high blood pressure or obesity. Treatment may include:  Sleeping on your side.  Using a decongestant if you have nasal congestion.  Avoiding the use of depressants, including alcohol, sedatives, and narcotics.  Losing weight if you are overweight.  Making changes to your diet.  Quitting smoking.  Using a device to open your airway while you sleep, such as: ? An oral appliance. This is a custom-made mouthpiece that shifts your lower jaw forward. ? A continuous positive airway pressure (CPAP) device. This device delivers oxygen to your airway through a mask. ? A nasal expiratory positive airway pressure (EPAP) device. This device has valves that you put into each nostril. ? A bi-level positive airway pressure (BPAP) device. This device delivers oxygen to your airway through a mask.  Surgery if other treatments do not work. During surgery, excess tissue is removed to create a wider airway.  It is important to get treatment for sleep apnea.  Without treatment, this condition can lead to:  High blood pressure.  Coronary artery disease.  (Men) An inability to achieve or maintain an erection (impotence).  Reduced thinking abilities.  Follow these instructions at home:  Make any lifestyle changes that your health care provider recommends.  Eat a healthy, well-balanced  diet.  Take over-the-counter and prescription medicines only as told by your health care provider.  Avoid using depressants, including alcohol, sedatives, and narcotics.  Take steps to lose weight if you are overweight.  If you were given a device to open your airway while you sleep, use it only as told by your health care provider.  Do not use any tobacco products, such as cigarettes, chewing tobacco, and e-cigarettes. If you need help quitting, ask your health care provider.  Keep all follow-up visits as told by your health care provider. This is important. Contact a health care provider if:  The device that you received to open your airway during sleep is uncomfortable or does not seem to be working.  Your symptoms do not improve.  Your symptoms get worse. Get help right away if:  You develop chest pain.  You develop shortness of breath.  You develop discomfort in your back, arms, or stomach.  You have trouble speaking.  You have weakness on one side of your body.  You have drooping in your face. These symptoms may represent a serious problem that is an emergency. Do not wait to see if the symptoms will go away. Get medical help right away. Call your local emergency services (911 in the [place] Do not drive yourself to the hospital. This information is not intended to replace advice given to you by your health care provider. Make sure you discuss any questions you have with your health care provider. Document Released: 05/27/2002 Document Revised: 01/31/2016 Document Reviewed: 03/16/2015 Elsevier Interactive Patient Education  [employer].

## 2018-05-10 ENCOUNTER — Telehealth: Payer: Self-pay | Admitting: Pulmonary Disease

## 2018-05-10 NOTE — Telephone Encounter (Signed)
We can order a home sleep study based on that information

## 2018-05-10 NOTE — Telephone Encounter (Signed)
Spoke with pt. States that she will not be able to do an in lab sleep study. 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 There isn't anyone who can care for her children while she does the sleep study. Pt would like to do a home sleep study if possible.  Dr. Wynona Neatlalere - please advise. Thanks.

## 2018-05-10 NOTE — Telephone Encounter (Signed)
Patient returned phone call; pt contact # [phone]

## 2018-05-10 NOTE — Telephone Encounter (Signed)
I have spoke with the PCC's due to the patients insurance Medicaid she will be unable to do a home sleep test due to them not covering this test. Even if patient paid for test the insurance would be unable to pay for supplies or the machine.    Dr.O the patient states that she is receiving [employer] insurance at the being of the year and would like to try the HST with the new insurance since she is unable to do it at this time. I advise her to call back when insurance is affective so we could place and order for this Hst.   Sending as a [place]

## 2018-05-10 NOTE — Telephone Encounter (Signed)
Patient returned call, CB is [phone]

## 2018-05-10 NOTE — Telephone Encounter (Signed)
lmtcb x1 for pt. 

## 2018-05-10 NOTE — Telephone Encounter (Signed)
Attempted to contact pt. I did not receive an answer. I have left a message for pt to return our call.  

## 2018-06-26 ENCOUNTER — Encounter (HOSPITAL_BASED_OUTPATIENT_CLINIC_OR_DEPARTMENT_OTHER): Payer: Medicaid Other

## 2018-07-02 ENCOUNTER — Telehealth: Payer: Self-pay | Admitting: Pulmonary Disease

## 2018-07-02 NOTE — Telephone Encounter (Signed)
Patient reserves the right to choose how we proceed with testing I can only provide a medical opinion about the need to treat the sleep apnea  The study is costly If she is not able to afford it, then she can make a choice to wait until she gets her insurance switched-this is not a clinical decision.  An in lab study is the standard of care for diagnosing sleep disordered breathing, a home sleep study is performed if you do not have any comorbidities and we are trying to diagnose a straightforward case of possible sleep apnea

## 2018-07-02 NOTE — Telephone Encounter (Signed)
lmtcb x1 for pt. 

## 2018-07-02 NOTE — Telephone Encounter (Signed)
Heather and Dr. Wynona Neat,  Please see pt's mychart message and advise on recs for pt. Thanks!

## 2018-07-03 NOTE — Telephone Encounter (Signed)
Pt is calling back [phone] 

## 2018-07-03 NOTE — Telephone Encounter (Signed)
Spoke with pt, she states she cannot take the sleep test in the lab due to her not having a caregiver for her twin girls who have special care needs. She has Medicare and they will only pay for in lab sleep studies but she cannot take her girls with her to the appt. She is really needing the test done but just does not have the means to pay for the HST out of pocket. I will call Medicaid in the am to see if there is anything we can do for her.

## 2018-07-03 NOTE — Telephone Encounter (Signed)
ATC pt, no answer. Left message for pt to call back.  

## 2018-07-04 NOTE — Telephone Encounter (Signed)
I spoke with pt and advised her that I spoke with Medicaid and they suggested to send a prior approval for a home sleep test. I printed out form and will work on getting all the supporting evidence to submit with her PA. Pt understood and I advised her that I would be calling her tomorrow for an update. Please leave in my box. Thanks.

## 2018-07-09 ENCOUNTER — Telehealth: Payer: Self-pay | Admitting: Pulmonary Disease

## 2018-07-09 NOTE — Telephone Encounter (Signed)
Dr.O we have handle this situation we will need your signature when you return to office on 07/18/17.

## 2018-07-09 NOTE — Telephone Encounter (Signed)
  Sonya Mitchell,   can you kindly look into this question, I could not find a question Is there anything that needs done right now?

## 2018-07-10 NOTE — Telephone Encounter (Signed)
Patient is returning phone call.  Phone number is [phone].

## 2018-07-10 NOTE — Telephone Encounter (Addendum)
ATC pt, no answer. There was no VM to leave message. Will try again later.

## 2018-07-10 NOTE — Telephone Encounter (Signed)
ATC pt, no answer. Left message for pt to call back.  

## 2018-07-11 NOTE — Telephone Encounter (Signed)
Pt is calling back [phone] pt will not be available for the next 20 minutes  Pt want's to talk to [facility]

## 2018-07-11 NOTE — Telephone Encounter (Signed)
Pt is returning call. Cb is [phone].

## 2018-07-11 NOTE — Telephone Encounter (Signed)
ATC pt, no answer. Left message for pt to call back.  

## 2018-07-11 NOTE — Telephone Encounter (Signed)
Atc patient was sent straight to voicemail left message to call back.

## 2018-07-11 NOTE — Telephone Encounter (Signed)
Pt is calling back [phone]

## 2018-07-11 NOTE — Telephone Encounter (Signed)
Attempted to call pt but line went straight to VM. Left message for pt to return call. 

## 2018-07-18 NOTE — Telephone Encounter (Signed)
ATC pt, no answer. Left message for pt to call back.  

## 2018-07-19 NOTE — Telephone Encounter (Signed)
done

## 2018-07-19 NOTE — Telephone Encounter (Signed)
Documentation was signed

## 2018-07-20 NOTE — Telephone Encounter (Signed)
I have  Faxed this off today nothing further needed I have left patient detailed message to update her.

## 2020-01-15 ENCOUNTER — Telehealth: Payer: Self-pay | Admitting: Pulmonary Disease

## 2020-01-20 NOTE — Telephone Encounter (Signed)
LVM for pt TCB and schedule appt - already established with Dr. Wynona Neat - pt just needs to call and set up appt - OV notes from [facility] are in brown folder//tsb

## 2020-01-23 NOTE — Telephone Encounter (Signed)
Called and left message on pt vm to call back to schedule f/u with Dr. Wynona Neat - 2 nd attempt - closed note - pr

## 2022-02-26 ENCOUNTER — Emergency Department (HOSPITAL_COMMUNITY): Payer: Medicaid Other

## 2022-02-26 ENCOUNTER — Emergency Department (HOSPITAL_COMMUNITY)
Admission: EM | Admit: 2022-02-26 | Discharge: 2022-02-26 | Disposition: A | Discharge status: Home / Self Care | Payer: Medicaid Other | Attending: Emergency Medicine | Admitting: Emergency Medicine

## 2022-02-26 ENCOUNTER — Other Ambulatory Visit: Payer: Self-pay

## 2022-02-26 ENCOUNTER — Encounter (HOSPITAL_COMMUNITY): Payer: Self-pay

## 2022-02-26 ENCOUNTER — Emergency Department (HOSPITAL_BASED_OUTPATIENT_CLINIC_OR_DEPARTMENT_OTHER): Payer: Medicaid Other

## 2022-02-26 DIAGNOSIS — R7989 Other specified abnormal findings of blood chemistry: Secondary | ICD-10-CM | POA: Diagnosis not present

## 2022-02-26 DIAGNOSIS — Z7951 Long term (current) use of inhaled steroids: Secondary | ICD-10-CM | POA: Insufficient documentation

## 2022-02-26 DIAGNOSIS — J45909 Unspecified asthma, uncomplicated: Secondary | ICD-10-CM | POA: Insufficient documentation

## 2022-02-26 DIAGNOSIS — R609 Edema, unspecified: Secondary | ICD-10-CM | POA: Diagnosis not present

## 2022-02-26 DIAGNOSIS — R21 Rash and other nonspecific skin eruption: Secondary | ICD-10-CM | POA: Diagnosis not present

## 2022-02-26 DIAGNOSIS — Z7952 Long term (current) use of systemic steroids: Secondary | ICD-10-CM | POA: Insufficient documentation

## 2022-02-26 DIAGNOSIS — M79661 Pain in right lower leg: Secondary | ICD-10-CM | POA: Insufficient documentation

## 2022-02-26 DIAGNOSIS — R079 Chest pain, unspecified: Secondary | ICD-10-CM | POA: Diagnosis not present

## 2022-02-26 DIAGNOSIS — M79604 Pain in right leg: Secondary | ICD-10-CM

## 2022-02-26 DIAGNOSIS — Z7982 Long term (current) use of aspirin: Secondary | ICD-10-CM | POA: Diagnosis not present

## 2022-02-26 DIAGNOSIS — R0600 Dyspnea, unspecified: Secondary | ICD-10-CM | POA: Insufficient documentation

## 2022-02-26 DIAGNOSIS — R0602 Shortness of breath: Secondary | ICD-10-CM | POA: Diagnosis present

## 2022-02-26 DIAGNOSIS — M79662 Pain in left lower leg: Secondary | ICD-10-CM | POA: Diagnosis not present

## 2022-02-26 LAB — CBC WITH DIFFERENTIAL/PLATELET
Abs Immature Granulocytes: 0.04 10*3/uL (ref 0.00–0.07)
Basophils Absolute: 0.1 10*3/uL (ref 0.0–0.1)
Basophils Relative: 1 %
Eosinophils Absolute: 0 10*3/uL (ref 0.0–0.5)
Eosinophils Relative: 0 %
HCT: 34.9 % — ABNORMAL LOW (ref 36.0–46.0)
Hemoglobin: 11.2 g/dL — ABNORMAL LOW (ref 12.0–15.0)
Immature Granulocytes: 0 %
Lymphocytes Relative: 17 %
Lymphs Abs: 1.6 10*3/uL (ref 0.7–4.0)
MCH: 31.2 pg (ref 26.0–34.0)
MCHC: 32.1 g/dL (ref 30.0–36.0)
MCV: 97.2 fL (ref 80.0–100.0)
Monocytes Absolute: 0.3 10*3/uL (ref 0.1–1.0)
Monocytes Relative: 4 %
Neutro Abs: 7.2 10*3/uL (ref 1.7–7.7)
Neutrophils Relative %: 78 %
Platelets: 318 10*3/uL (ref 150–400)
RBC: 3.59 MIL/uL — ABNORMAL LOW (ref 3.87–5.11)
RDW: 13.3 % (ref 11.5–15.5)
WBC: 9.2 10*3/uL (ref 4.0–10.5)
nRBC: 0 % (ref 0.0–0.2)

## 2022-02-26 LAB — COMPREHENSIVE METABOLIC PANEL
ALT: 24 U/L (ref 0–44)
AST: 16 U/L (ref 15–41)
Albumin: 4 g/dL (ref 3.5–5.0)
Alkaline Phosphatase: 68 U/L (ref 38–126)
Anion gap: 6 (ref 5–15)
BUN: 18 mg/dL (ref 6–20)
CO2: 27 mmol/L (ref 22–32)
Calcium: 9.3 mg/dL (ref 8.9–10.3)
Chloride: 107 mmol/L (ref 98–111)
Creatinine, Ser: 1.13 mg/dL — ABNORMAL HIGH (ref 0.44–1.00)
GFR, Estimated: 58 mL/min — ABNORMAL LOW (ref >60)
Glucose, Bld: 125 mg/dL — ABNORMAL HIGH (ref 70–99)
Potassium: 3.7 mmol/L (ref 3.5–5.1)
Sodium: 140 mmol/L (ref 135–145)
Total Bilirubin: 0.6 mg/dL (ref 0.3–1.2)
Total Protein: 7.5 g/dL (ref 6.5–8.1)

## 2022-02-26 LAB — TROPONIN I (HIGH SENSITIVITY): Troponin I (High Sensitivity): <2 ng/L (ref <18)

## 2022-02-26 LAB — BRAIN NATRIURETIC PEPTIDE: B Natriuretic Peptide: 23.5 pg/mL (ref 0.0–100.0)

## 2022-02-26 LAB — MAGNESIUM: Magnesium: 2.3 mg/dL (ref 1.7–2.4)

## 2022-02-26 MED ORDER — SODIUM CHLORIDE (PF) 0.9 % IJ SOLN
INTRAMUSCULAR | Status: AC
  Filled 2022-02-26: qty 50

## 2022-02-26 MED ORDER — GADOBUTROL 1 MMOL/ML IV SOLN
10.0000 mL | Freq: Once | INTRAVENOUS | Status: AC | PRN
Start: 2022-02-26 — End: 2022-02-26
  Administered 2022-02-26: 10 mL via INTRAVENOUS

## 2022-02-26 MED ORDER — IOHEXOL 350 MG/ML SOLN
100.0000 mL | Freq: Once | INTRAVENOUS | Status: AC | PRN
Start: 2022-02-26 — End: 2022-02-26
  Administered 2022-02-26: 100 mL via INTRAVENOUS

## 2022-02-26 NOTE — Progress Notes (Signed)
Bilateral lower extremity venous duplex completed. Refer to "CV Proc" under chart review to view preliminary results.  02/26/2022 9:32 AM Eula Fried., MHA, RVT, RDCS, RDMS

## 2022-02-26 NOTE — ED Provider Notes (Signed)
[facility]-EMERGENCY DEPT Provider Note   CSN: [id number] Arrival date & time: 02/26/22  0340     History {Add pertinent medical, surgical, social history, OB history to HPI:1} Chief Complaint  Patient presents with   Abnormal Lab    Sonya Mitchell is a 56 y.o. female.  HPI       Has been getting bit by mosquitos over the past 2 months 8/21, 3 areas of swelling to right AC, left AC, right knee, thought it was due to allergic reaction. Gave antibiotics.  Feet, toes swelling. Gave rx for cefadroxil, epi pen, prednisone due to concerns for allergic reaction and cellulitis  9/6 medicine seemed like it was working first few days then it stopped working and went to urgent care again 9/6  Started increased dose of cefadroxil, prednisone, and gabapentin. Had labs that showed positive ddimer  Redness and blotchiness of both legs . And feels like things crawling, itching of both arms.  Shortness of breath. Has always had respiratory issues but since mosquito allergy having more fatigue, more dyspnea.  Having pain to right side of chest went away at first then came back and more intense. Worse leaning forward. Feels lightheaded when leaning forwards. Fatigue.  Used to carpal tunnel and now having new symptoms.  Seeing vein specialist, set up MRI neurology this Wednesday.   Pain level between 8-9/10, single mom taking care of twins that have special needs    Home Medications Prior to Admission medications   Medication Sig Start Date End Date Taking? Authorizing Provider  albuterol (PROVENTIL HFA;VENTOLIN HFA) 108 (90 Base) MCG/ACT inhaler Inhale 2 puffs into the lungs every 6 (six) hours as needed for wheezing or shortness of breath.    [provider]  aspirin 81 MG tablet Take 81 mg by mouth daily.    [provider]  beclomethasone (QVAR) 80 MCG/ACT inhaler Inhale 2 puffs into the lungs 2 (two) times daily.    [provider]  budesonide  (RHINOCORT ALLERGY) 32 MCG/ACT nasal spray Place 1 spray into both nostrils daily.    [provider]  Butalbital-APAP-Caffeine 50-300-40 MG CAPS Take 1-2 tablets by mouth daily as needed (headaches).  05/22/14   [provider]  cetirizine (ZYRTEC) 10 MG tablet Take 10 mg by mouth daily as needed for allergies.     [provider]  EPIPEN 2-PAK 0.3 MG/0.3ML SOAJ injection USE FOR SEVERE ALLERGIC REACTION 10/23/15   Bobbitt, Heywood Iles, MD  fluticasone [facility]) 50 MCG/ACT nasal spray Place 1 spray into both nostrils daily. 10/17/17   [provider]  montelukast (SINGULAIR) 10 MG tablet Take 10 mg by mouth daily. 12/23/14   [provider]  Multiple Vitamins-Minerals (HAIR SKIN AND NAILS FORMULA PO) Take 1 tablet by mouth daily.    [provider]  naproxen (NAPROSYN) 500 MG tablet Take 1 tablet (500 mg total) by mouth 2 (two) times daily with a meal. 05/12/16   Everlene Farrier, PA-C  predniSONE (DELTASONE) 20 MG tablet Take 2 tablets (40 mg total) by mouth daily with breakfast. For the next four days 04/05/18   Gerhard Munch, MD      Allergies    Bee venom, Dicyclomine, and Pollen extract    Review of Systems   Review of Systems  Physical Exam Updated Vital Signs BP 124/78 (BP Location: Right Arm)   Pulse 74   Temp 98.4 F (36.9 C) (Oral)   Resp 17   Ht 5\' 6"  (1.676  m)   Wt 108.9 kg   SpO2 98%   BMI 38.74 kg/m  Physical Exam  ED Results / Procedures / Treatments   Labs (all labs ordered are listed, but only abnormal results are displayed) Labs Reviewed - No data to display  EKG None  Radiology No results found.  Procedures Procedures  {Document cardiac monitor, telemetry assessment procedure when appropriate:1}  Medications Ordered in ED Medications - No data to display  ED Course/ Medical Decision Making/ A&P                           Medical Decision Making  ***  {Document critical care time when  appropriate:1} {Document review of labs and clinical decision tools ie heart score, Chads2Vasc2 etc:1}  {Document your independent review of radiology images, and any outside records:1} {Document your discussion with family members, caretakers, and with consultants:1} {Document social determinants of health affecting pt's care:1} {Document your decision making why or why not admission, treatments were needed:1} Final Clinical Impression(s) / ED Diagnoses Final diagnoses:  None    Rx / DC Orders ED Discharge Orders     None

## 2022-02-26 NOTE — ED Notes (Signed)
Patient transported to MRI 

## 2022-02-26 NOTE — ED Triage Notes (Addendum)
Pt reports with an abnormal d dimer 3 days ago (870) from urgent care. Pt has multiple complaints.

## 2022-06-28 ENCOUNTER — Other Ambulatory Visit: Payer: Self-pay

## 2022-06-28 ENCOUNTER — Emergency Department (HOSPITAL_COMMUNITY)
Admission: EM | Admit: 2022-06-28 | Discharge: 2022-06-29 | Disposition: A | Discharge status: Home / Self Care | Payer: Medicaid Other | Attending: Emergency Medicine | Admitting: Emergency Medicine

## 2022-06-28 ENCOUNTER — Encounter (HOSPITAL_COMMUNITY): Payer: Self-pay

## 2022-06-28 ENCOUNTER — Emergency Department (HOSPITAL_COMMUNITY): Payer: Medicaid Other

## 2022-06-28 DIAGNOSIS — R0781 Pleurodynia: Secondary | ICD-10-CM | POA: Diagnosis not present

## 2022-06-28 DIAGNOSIS — R002 Palpitations: Secondary | ICD-10-CM | POA: Diagnosis not present

## 2022-06-28 LAB — BASIC METABOLIC PANEL
Anion gap: 11 (ref 5–15)
BUN: 8 mg/dL (ref 6–20)
CO2: 24 mmol/L (ref 22–32)
Calcium: 9.2 mg/dL (ref 8.9–10.3)
Chloride: 103 mmol/L (ref 98–111)
Creatinine, Ser: 0.97 mg/dL (ref 0.44–1.00)
GFR, Estimated: >60 mL/min (ref >60)
Glucose, Bld: 86 mg/dL (ref 70–99)
Potassium: 3.8 mmol/L (ref 3.5–5.1)
Sodium: 138 mmol/L (ref 135–145)

## 2022-06-28 LAB — CBC WITH DIFFERENTIAL/PLATELET
Abs Immature Granulocytes: 0.02 10*3/uL (ref 0.00–0.07)
Basophils Absolute: 0.1 10*3/uL (ref 0.0–0.1)
Basophils Relative: 1 %
Eosinophils Absolute: 0 10*3/uL (ref 0.0–0.5)
Eosinophils Relative: 1 %
HCT: 38.1 % (ref 36.0–46.0)
Hemoglobin: 12.5 g/dL (ref 12.0–15.0)
Immature Granulocytes: 0 %
Lymphocytes Relative: 41 %
Lymphs Abs: 2.6 10*3/uL (ref 0.7–4.0)
MCH: 31.3 pg (ref 26.0–34.0)
MCHC: 32.8 g/dL (ref 30.0–36.0)
MCV: 95.5 fL (ref 80.0–100.0)
Monocytes Absolute: 0.4 10*3/uL (ref 0.1–1.0)
Monocytes Relative: 6 %
Neutro Abs: 3.4 10*3/uL (ref 1.7–7.7)
Neutrophils Relative %: 51 %
Platelets: 336 10*3/uL (ref 150–400)
RBC: 3.99 MIL/uL (ref 3.87–5.11)
RDW: 12.3 % (ref 11.5–15.5)
WBC: 6.5 10*3/uL (ref 4.0–10.5)
nRBC: 0 % (ref 0.0–0.2)

## 2022-06-28 LAB — I-STAT BETA HCG BLOOD, ED (MC, WL, AP ONLY): I-stat hCG, quantitative: <5 m[IU]/mL (ref <5)

## 2022-06-28 LAB — TROPONIN I (HIGH SENSITIVITY): Troponin I (High Sensitivity): <2 ng/L (ref <18)

## 2022-06-28 MED ORDER — ACETAMINOPHEN 500 MG PO TABS
1000.0000 mg | ORAL_TABLET | Freq: Once | ORAL | Status: AC
  Administered 2022-06-28: 1000 mg via ORAL
  Filled 2022-06-28: qty 2

## 2022-06-28 MED ORDER — IOHEXOL 350 MG/ML SOLN
100.0000 mL | Freq: Once | INTRAVENOUS | Status: AC | PRN
Start: 2022-06-28 — End: 2022-06-28
  Administered 2022-06-28: 100 mL via INTRAVENOUS

## 2022-06-28 MED ORDER — KETOROLAC TROMETHAMINE 15 MG/ML IJ SOLN
15.0000 mg | Freq: Once | INTRAMUSCULAR | Status: AC
  Administered 2022-06-28: 15 mg via INTRAMUSCULAR
  Filled 2022-06-28: qty 1

## 2022-06-28 MED ORDER — FAMOTIDINE 20 MG PO TABS
20.0000 mg | ORAL_TABLET | Freq: Once | ORAL | Status: AC
  Administered 2022-06-28: 20 mg via ORAL
  Filled 2022-06-28: qty 1

## 2022-06-28 NOTE — ED Notes (Signed)
Pt needs 20 g or greater IV in forearm or higher for CT  

## 2022-06-28 NOTE — ED Provider Notes (Signed)
[facility]-EMERGENCY DEPT Provider Note   CSN: [id number] Arrival date & time: 06/28/22  1157     History Chief Complaint  Patient presents with   Palpitations    HPI Sonya Mitchell is a 57 y.o. female presenting for chest pain.  She states that she has had right-sided chest pain over the last 4 weeks.  It has been more acute over the last 48 hours.  She denies fevers or chills but endorses pleuritic pain.  Otherwise ambulatory tolerating p.o. intake.  Took aspirin and Goody powder with some mild improvement today.  Patient's recorded medical, surgical, social, medication list and allergies were reviewed in the Snapshot window as part of the initial history.   Review of Systems   Review of Systems  Constitutional:  Negative for chills and fever.  HENT:  Negative for ear pain and sore throat.   Eyes:  Negative for pain and visual disturbance.  Respiratory:  Negative for cough and shortness of breath.   Cardiovascular:  Positive for chest pain. Negative for palpitations.  Gastrointestinal:  Negative for abdominal pain and vomiting.  Genitourinary:  Negative for dysuria and hematuria.  Musculoskeletal:  Negative for arthralgias and back pain.  Skin:  Negative for color change and rash.  Neurological:  Negative for seizures and syncope.  All other systems reviewed and are negative.   Physical Exam Updated Vital Signs BP (!) 123/93   Pulse 70   Temp 98 F (36.7 C)   Resp 14   SpO2 98%  Physical Exam Vitals and nursing note reviewed.  Constitutional:      General: She is not in acute distress.    Appearance: She is well-developed.  HENT:     Head: Normocephalic and atraumatic.  Eyes:     Conjunctiva/sclera: Conjunctivae normal.  Cardiovascular:     Rate and Rhythm: Normal rate and regular rhythm.     Heart sounds: No murmur heard. Pulmonary:     Effort: Pulmonary effort is normal. No respiratory distress.     Breath sounds: Normal breath sounds.   Abdominal:     General: There is no distension.     Palpations: Abdomen is soft.     Tenderness: There is no abdominal tenderness. There is no right CVA tenderness or left CVA tenderness.  Musculoskeletal:        General: Tenderness present. No swelling. Normal range of motion.     Cervical back: Neck supple.  Skin:    General: Skin is warm and dry.  Neurological:     General: No focal deficit present.     Mental Status: She is alert and oriented to person, place, and time. Mental status is at baseline.     Cranial Nerves: No cranial nerve deficit.      ED Course/ Medical Decision Making/ A&P    Procedures Procedures   Medications Ordered in ED Medications  ketorolac (TORADOL) 15 MG/ML injection 15 mg (15 mg Intramuscular Given 06/28/22 2023)  acetaminophen (TYLENOL) tablet 1,000 mg (1,000 mg Oral Given 06/28/22 2024)  famotidine (PEPCID) tablet 20 mg (20 mg Oral Given 06/28/22 2024)   Medical Decision Making: Sonya Mitchell is a 57 y.o. female who presented to the ED today with chest pain, detailed above.  Based on patient's comorbidities, patient has a heart score of 1.    Patient's presentation is complicated by their history of multiple comorbid medical problems including elevated BMI.  Patient placed on continuous vitals and telemetry monitoring while in  ED which was reviewed periodically.  Complete initial physical exam performed, notably the patient was hemodynamically stable no acute distress.   Reviewed and confirmed nursing documentation for past medical history, family history, social history.    Initial Assessment: With the patient's presentation of left-sided chest pain, most likely diagnosis is musculoskeletal chest pain versus GERD, although ACS remains on the differential. Other diagnoses were considered including (but not limited to) pulmonary embolism, community-acquired pneumonia, aortic dissection, pneumothorax, underlying bony abnormality, anemia. These are  considered less likely due to history of present illness and physical exam findings.    In particular, concerning pulmonary embolism: Patient is PERC + and the they deny malignancy, recent surgery, history of DVT, or calf tenderness leading to a low risk Wells score. Aortic Dissection also reconsidered but seems less likely based on the location, quality, onset, and severity of symptoms in this case.  Patient also has a lack of underlying history of AD or TAA.  This is most consistent with an acute life/limb threatening illness complicated by underlying chronic conditions.   Initial Plan: Evaluate for ACS with single (given duration of symptoms) troponin and EKG evaluated as below  Evaluate for dissection, bony abnormality, or pneumonia with chest x-ray and screening laboratory evaluation including CBC, BMP  Further evaluation for pulmonary embolism indicated at this time based on patient's PERC and Wells score.  Patient states that she is having very pleuritic chest pain and is worried about a pulmonary embolism.  Given patient's risk, will proceed to CT angiography. Further evaluation for Thoracic Aortic Dissection not indicated at this time based on patient's clinical history and PE findings.   Initial Study Results: EKG was reviewed independently. Rate, rhythm, axis, intervals all examined and without medically relevant abnormality. ST segments without concerns for elevations.    Laboratory  Single troponin demonstrated no acute abnormality  CBC and BMP without obvious metabolic or inflammatory abnormalities requiring further evaluation   Radiology  DG Chest 1 View  Result Date: 06/28/2022 CLINICAL DATA:  Chest pain, shortness of breath. EXAM: CHEST  1 VIEW COMPARISON:  November 23, 2017.  February 26, 2022. FINDINGS: The heart size and mediastinal contours are within normal limits. Both lungs are clear. The visualized skeletal structures are unremarkable. IMPRESSION: No active disease.  Electronically Signed   By: Marijo Conception M.D.   On: 06/28/2022 13:11    Final Assessment and Plan: CT angiography of the chest ordered due to patient's persistent pain, tachycardia, tachypnea on arrival.  Had a shared medical decision making with patient.  She had no clinical improvement from NSAID therapy.  She would like to proceed with CT angiography due to her history of lymphedema and her concern for PE.  Patient handed off to oncoming provider at this time, please see their note for ultimate care and disposition.  If CTA PE study is normal, patient will be stable for discharge.    Clinical Impression:  1. Palpitations      Data Unavailable   Final Clinical Impression(s) / ED Diagnoses Final diagnoses:  Palpitations    Rx / DC Orders ED Discharge Orders     None         Tretha Sciara, MD 06/28/22 2301

## 2022-06-28 NOTE — ED Provider Triage Note (Signed)
Emergency Medicine Provider Triage Evaluation Note  Sonya Mitchell , a 57 y.o. female  was evaluated in triage.  Pt complains of right-sided chest pain associate with shortness of breath x 3 days.  Patient describes chest pain as a pulling sensation in her chest worse with movement and palpation.  No cardiac issues.  She admits to chronic lower extremity edema.  No history of blood clots.  Review of Systems  Positive: CP Negative: fever  Physical Exam  BP (!) 139/113 (BP Location: Left Arm)   Pulse 99   Temp 98 F (36.7 C) (Oral)   Resp 18   SpO2 100%  Gen:   Awake, no distress   Resp:  Normal effort  MSK:   Moves extremities without difficulty  Other:  TTP to right side of chest  Medical Decision Making  Medically screening exam initiated at 12:50 PM.  Appropriate orders placed.  Sonya Mitchell was informed that the remainder of the evaluation will be completed by another provider, this initial triage assessment does not replace that evaluation, and the importance of remaining in the ED until their evaluation is complete.  MSK etiology? Cardiac labs ordered to rule out cardiac etiology.    Suzy Bouchard, [place] 06/28/22 1251

## 2022-06-28 NOTE — ED Triage Notes (Signed)
Patient said she has trouble breathing and tightness in her chest for 3 days. Michela Pitcher it feels like someone is pulling her chest.

## 2022-06-28 NOTE — ED Notes (Signed)
Will attempt US PIV 

## 2022-06-28 NOTE — ED Notes (Signed)
Pt refused VS. Triage RN aware

## 2022-06-29 LAB — TROPONIN I (HIGH SENSITIVITY): Troponin I (High Sensitivity): <2 ng/L (ref <18)

## 2022-06-29 MED ORDER — NAPROXEN 375 MG PO TABS: ORAL_TABLET | ORAL | 0 refills | Status: DC

## 2022-06-29 MED ORDER — PANTOPRAZOLE SODIUM 40 MG PO TBEC: 40.0000 mg | DELAYED_RELEASE_TABLET | Freq: Every day | ORAL | 0 refills | Status: DC

## 2022-06-29 MED ORDER — PANTOPRAZOLE SODIUM 40 MG PO TBEC: 40.0000 mg | DELAYED_RELEASE_TABLET | Freq: Every day | ORAL | 0 refills | Status: AC

## 2022-06-29 NOTE — ED Provider Notes (Addendum)
Nursing notes and vitals signs, including pulse oximetry, reviewed.  Summary of this visit's results, reviewed by myself:  EKG:  EKG Interpretation  Date/Time:  Tuesday June 28 2022 12:44:20 EST Ventricular Rate:  82 PR Interval:  157 QRS Duration: 90 QT Interval:  350 QTC Calculation: 409 R Axis:   74 Text Interpretation: Sinus rhythm Confirmed by Tretha Sciara [phone]) on 06/28/2022 7:15:19 PM        Labs:  Results for orders placed or performed during the hospital encounter of 06/28/22 (from the past 24 hour(s))  CBC with Differential     Status: None   Collection Time: 06/28/22  1:19 PM  Result Value Ref Range   WBC 6.5 4.0 - 10.5 K/uL   RBC 3.99 3.87 - 5.11 MIL/uL   Hemoglobin 12.5 12.0 - 15.0 g/dL   HCT 38.1 36.0 - 46.0 %   MCV 95.5 80.0 - 100.0 fL   MCH 31.3 26.0 - 34.0 pg   MCHC 32.8 30.0 - 36.0 g/dL   RDW 12.3 11.5 - 15.5 %   Platelets 336 150 - 400 K/uL   nRBC 0.0 0.0 - 0.2 %   Neutrophils Relative % 51 %   Neutro Abs 3.4 1.7 - 7.7 K/uL   Lymphocytes Relative 41 %   Lymphs Abs 2.6 0.7 - 4.0 K/uL   Monocytes Relative 6 %   Monocytes Absolute 0.4 0.1 - 1.0 K/uL   Eosinophils Relative 1 %   Eosinophils Absolute 0.0 0.0 - 0.5 K/uL   Basophils Relative 1 %   Basophils Absolute 0.1 0.0 - 0.1 K/uL   Immature Granulocytes 0 %   Abs Immature Granulocytes 0.02 0.00 - 0.07 K/uL  Basic metabolic panel     Status: None   Collection Time: 06/28/22  1:19 PM  Result Value Ref Range   Sodium 138 135 - 145 mmol/L   Potassium 3.8 3.5 - 5.1 mmol/L   Chloride 103 98 - 111 mmol/L   CO2 24 22 - 32 mmol/L   Glucose, Bld 86 70 - 99 mg/dL   BUN 8 6 - 20 mg/dL   Creatinine, Ser 0.97 0.44 - 1.00 mg/dL   Calcium 9.2 8.9 - 10.3 mg/dL   GFR, Estimated >60 >60 mL/min   Anion gap 11 5 - 15  Troponin I (High Sensitivity)     Status: None   Collection Time: 06/28/22  1:19 PM  Result Value Ref Range   Troponin I (High Sensitivity) <2 <18 ng/L  I-Stat Beta hCG blood, ED ([facility],  [facility], [facility] only)     Status: None   Collection Time: 06/28/22  1:41 PM  Result Value Ref Range   I-stat hCG, quantitative <5.0 <5 mIU/mL   Comment 3          Troponin I (High Sensitivity)     Status: None   Collection Time: 06/28/22 11:26 PM  Result Value Ref Range   Troponin I (High Sensitivity) <2 <18 ng/L    Imaging Studies: CT Angio Chest Pulmonary Embolism (PE) W or WO Contrast  Result Date: 06/28/2022 CLINICAL DATA:  Trouble breathing and tightness in the chest for 3 days. EXAM: CT ANGIOGRAPHY CHEST WITH CONTRAST TECHNIQUE: Multidetector CT imaging of the chest was performed using the standard protocol during bolus administration of intravenous contrast. Multiplanar CT image reconstructions and MIPs were obtained to evaluate the vascular anatomy. RADIATION DOSE REDUCTION: This exam was performed according to the departmental dose-optimization program which includes automated exposure control, adjustment of the mA  and/or kV according to patient size and/or use of iterative reconstruction technique. CONTRAST:  121mL OMNIPAQUE IOHEXOL 350 MG/ML SOLN COMPARISON:  Chest radiograph performed earlier on the same date. FINDINGS: Cardiovascular: Satisfactory opacification of the pulmonary arteries to the segmental level. No evidence of pulmonary embolism. Normal heart size. No pericardial effusion. Mediastinum/Nodes: No enlarged mediastinal, hilar, or axillary lymph nodes. Thyroid gland, trachea, and esophagus demonstrate no significant findings. Lungs/Pleura: No focal consolidation or pleural effusion. No pneumothorax. Respiratory motion limits evaluation. Upper Abdomen: No acute abnormality. Musculoskeletal: No chest wall abnormality. No acute or significant osseous findings. Review of the MIP images confirms the above findings. IMPRESSION: No evidence of pulmonary embolism or other acute intrathoracic process. Electronically Signed   By: Keane Police D.O.   On: 06/28/2022 23:57   DG Chest 1 View  Result  Date: 06/28/2022 CLINICAL DATA:  Chest pain, shortness of breath. EXAM: CHEST  1 VIEW COMPARISON:  November 23, 2017.  February 26, 2022. FINDINGS: The heart size and mediastinal contours are within normal limits. Both lungs are clear. The visualized skeletal structures are unremarkable. IMPRESSION: No active disease. Electronically Signed   By: Marijo Conception M.D.   On: 06/28/2022 13:11      Ralphie Lovelady, Jenny Reichmann, MD 06/29/22 0011    Shanon Rosser, MD 06/29/22 Quentin Mulling

## 2023-10-01 ENCOUNTER — Emergency Department (HOSPITAL_BASED_OUTPATIENT_CLINIC_OR_DEPARTMENT_OTHER)
Admission: EM | Admit: 2023-10-01 | Discharge: 2023-10-01 | Disposition: A | Discharge status: Home / Self Care | Attending: Emergency Medicine | Admitting: Emergency Medicine

## 2023-10-01 ENCOUNTER — Other Ambulatory Visit: Payer: Self-pay

## 2023-10-01 ENCOUNTER — Emergency Department (HOSPITAL_BASED_OUTPATIENT_CLINIC_OR_DEPARTMENT_OTHER): Admitting: Radiology

## 2023-10-01 ENCOUNTER — Encounter (HOSPITAL_BASED_OUTPATIENT_CLINIC_OR_DEPARTMENT_OTHER): Payer: Self-pay

## 2023-10-01 DIAGNOSIS — Y92481 Parking lot as the place of occurrence of the external cause: Secondary | ICD-10-CM | POA: Insufficient documentation

## 2023-10-01 DIAGNOSIS — S39012A Strain of muscle, fascia and tendon of lower back, initial encounter: Secondary | ICD-10-CM | POA: Insufficient documentation

## 2023-10-01 DIAGNOSIS — M545 Low back pain, unspecified: Secondary | ICD-10-CM | POA: Diagnosis present

## 2023-10-01 DIAGNOSIS — M546 Pain in thoracic spine: Secondary | ICD-10-CM | POA: Diagnosis not present

## 2023-10-01 DIAGNOSIS — Z7982 Long term (current) use of aspirin: Secondary | ICD-10-CM | POA: Diagnosis not present

## 2023-10-01 MED ORDER — CYCLOBENZAPRINE HCL 10 MG PO TABS: 5.0000 mg | ORAL_TABLET | Freq: Every evening | ORAL | 0 refills | Status: AC | PRN

## 2023-10-01 MED ORDER — ACETAMINOPHEN 500 MG PO TABS
1000.0000 mg | ORAL_TABLET | Freq: Once | ORAL | Status: AC
  Administered 2023-10-01: 1000 mg via ORAL
  Filled 2023-10-01: qty 2
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
## 2023-11-04 ENCOUNTER — Other Ambulatory Visit: Payer: Self-pay

## 2023-11-04 ENCOUNTER — Encounter (HOSPITAL_BASED_OUTPATIENT_CLINIC_OR_DEPARTMENT_OTHER): Payer: Self-pay | Admitting: Emergency Medicine

## 2023-11-04 ENCOUNTER — Emergency Department (HOSPITAL_BASED_OUTPATIENT_CLINIC_OR_DEPARTMENT_OTHER)
Admission: EM | Admit: 2023-11-04 | Discharge: 2023-11-04 | Disposition: A | Discharge status: Home / Self Care | Attending: Emergency Medicine | Admitting: Emergency Medicine

## 2023-11-04 DIAGNOSIS — S59911A Unspecified injury of right forearm, initial encounter: Secondary | ICD-10-CM | POA: Diagnosis present

## 2023-11-04 DIAGNOSIS — W57XXXA Bitten or stung by nonvenomous insect and other nonvenomous arthropods, initial encounter: Secondary | ICD-10-CM | POA: Diagnosis not present

## 2023-11-04 DIAGNOSIS — S50862A Insect bite (nonvenomous) of left forearm, initial encounter: Secondary | ICD-10-CM | POA: Diagnosis not present

## 2023-11-04 DIAGNOSIS — S50861A Insect bite (nonvenomous) of right forearm, initial encounter: Secondary | ICD-10-CM | POA: Diagnosis not present

## 2023-11-04 DIAGNOSIS — Z7982 Long term (current) use of aspirin: Secondary | ICD-10-CM | POA: Insufficient documentation

## 2023-11-04 DIAGNOSIS — Z7951 Long term (current) use of inhaled steroids: Secondary | ICD-10-CM | POA: Insufficient documentation

## 2023-11-04 DIAGNOSIS — J45909 Unspecified asthma, uncomplicated: Secondary | ICD-10-CM | POA: Diagnosis not present

## 2023-11-04 MED ORDER — ALBUTEROL SULFATE HFA 108 (90 BASE) MCG/ACT IN AERS
2.0000 | INHALATION_SPRAY | Freq: Once | RESPIRATORY_TRACT | Status: AC
  Administered 2023-11-04: 2 via RESPIRATORY_TRACT
  Filled 2023-11-04: qty 6.7

## 2023-11-04 MED ORDER — LORATADINE 10 MG PO TABS
10.0000 mg | ORAL_TABLET | Freq: Every day | ORAL | Status: DC
  Administered 2023-11-04: 10 mg via ORAL
  Filled 2023-11-04: qty 1

## 2023-11-04 MED ORDER — FAMOTIDINE 20 MG PO TABS
40.0000 mg | ORAL_TABLET | Freq: Once | ORAL | Status: AC
  Administered 2023-11-04: 40 mg via ORAL
  Filled 2023-11-04: qty 2

## 2023-11-04 NOTE — Discharge Instructions (Signed)
 Use Claritin or Zyrtec (found over-the-counter) daily and Pepcid  daily for symptoms of itching. You can also use topical Benadryl as needed.

## 2023-11-04 NOTE — ED Triage Notes (Signed)
 Pt reports insect bites all over since about noon; c/o itching

## 2023-11-04 NOTE — ED Notes (Signed)

## 2023-11-04 NOTE — ED Provider Notes (Signed)
 [place] EMERGENCY DEPARTMENT AT [facility] Provider Note   CSN: [id number] Arrival date & time: 11/04/23  1823     History  Chief Complaint  Patient presents with   Insect Bite    Sonya Mitchell is a 58 y.o. female.  Patient with history of allergy and asthma to ED after being bitten by insects this afternoon with complaint of itching. No wheezing, difficulty swallowing. The bites are on exposed areas of arms and legs. She did not take anything for symptoms prior to arrival.   The history is provided by the patient. No language interpreter was used.       Home Medications Prior to Admission medications   Medication Sig Start Date End Date Taking? Authorizing Provider  albuterol  (PROVENTIL  HFA;VENTOLIN  HFA) 108 (90 Base) MCG/ACT inhaler Inhale 2 puffs into the lungs every 6 (six) hours as needed for wheezing or shortness of breath.    [provider]  aspirin 81 MG tablet Take 81 mg by mouth daily.    [provider]  beclomethasone (QVAR) 80 MCG/ACT inhaler Inhale 2 puffs into the lungs 2 (two) times daily.    [provider]  budesonide (RHINOCORT ALLERGY) 32 MCG/ACT nasal spray Place 1 spray into both nostrils daily.    [provider]  Butalbital-APAP-Caffeine 50-300-40 MG CAPS Take 1-2 tablets by mouth daily as needed (headaches).  05/22/14   [provider]  cetirizine (ZYRTEC) 10 MG tablet Take 10 mg by mouth daily as needed for allergies.     [provider]  cyclobenzaprine  (FLEXERIL ) 10 MG tablet Take 0.5-1 tablets (5-10 mg total) by mouth at bedtime as needed for muscle spasms. 10/01/23   Rexie Catena, PA-C  EPIPEN  2-PAK 0.3 MG/0.3ML SOAJ injection USE FOR SEVERE ALLERGIC REACTION 10/23/15   Bobbitt, Colen Daunt, MD  fluticasone [facility] 50 MCG/ACT nasal spray Place 1 spray into both nostrils daily. 10/17/17   [provider]  montelukast (SINGULAIR) 10 MG tablet Take 10 mg by mouth daily.  12/23/14   [provider]  Multiple Vitamins-Minerals (HAIR SKIN AND NAILS FORMULA PO) Take 1 tablet by mouth daily.    [provider]  naproxen  (NAPROSYN ) 375 MG tablet Take 1 tablet twice daily as needed for chest wall pain. 06/29/22   Molpus, John, MD  pantoprazole  (PROTONIX ) 40 MG tablet Take 1 tablet (40 mg total) by mouth daily. 06/29/22   Molpus, John, MD  predniSONE  (DELTASONE ) 20 MG tablet Take 2 tablets (40 mg total) by mouth daily with breakfast. For the next four days 04/05/18   Dorenda Gandy, MD      Allergies    Bee venom, Dicyclomine, and Pollen extract    Review of Systems   Review of Systems  Physical Exam Updated Vital Signs BP 132/66 (BP Location: Right Arm)   Pulse (!) 104   Temp 99 F (37.2 C) (Oral)   Resp 14   Ht 5\' 6"  (1.676 m)   Wt 102.1 kg   SpO2 95%   BMI 36.33 kg/m  Physical Exam Vitals and nursing note reviewed.  Constitutional:      General: She is not in acute distress.    Appearance: She is well-developed. She is not ill-appearing.  Pulmonary:     Effort: Pulmonary effort is normal.     Breath sounds: No stridor. No wheezing.  Musculoskeletal:        General: Normal range of motion.     Cervical back: Normal range of motion.  Skin:    General: Skin is warm and dry.     Comments: Small, raised, singular bumps over forearms bilaterally. No welts, hives or erythema.  Neurological:     Mental Status: She is alert and oriented to person, place, and time.     ED Results / Procedures / Treatments   Labs (all labs ordered are listed, but only abnormal results are displayed) Labs Reviewed - No data to display  EKG None  Radiology No results found.  Procedures Procedures    Medications Ordered in ED Medications  famotidine  (PEPCID ) tablet 40 mg (has no administration in time range)  loratadine (CLARITIN) tablet 10 mg (has no administration in time range)  albuterol  (VENTOLIN  HFA) 108 (90 Base) MCG/ACT inhaler 2  puff (has no administration in time range)    ED Course/ Medical Decision Making/ A&P Clinical Course as of 11/04/23 1914  Sat Nov 04, 2023  1912 Patient with insect bites to arms. No wheezing or hives to suggest allergic reaction. Treated with Pepcid  and Claritin. Return if symptoms of allergic reaction develop. [SU]    Clinical Course User Index [SU] Mandy Second, PA-C                                 Medical Decision Making          Final Clinical Impression(s) / ED Diagnoses Final diagnoses:  Insect bite of forearm, unspecified laterality, initial encounter    Rx / DC Orders ED Discharge Orders     None         Mandy Second, PA-C 11/04/23 1914    Lowery Rue, DO 11/04/23 1951

## 2024-01-01 ENCOUNTER — Ambulatory Visit (INDEPENDENT_AMBULATORY_CARE_PROVIDER_SITE_OTHER): Admitting: Internal Medicine

## 2024-01-01 VITALS — BP 132/76 | HR 94 | Temp 98.1°F | Resp 20 | Ht 65.0 in | Wt 263.6 lb

## 2024-01-01 DIAGNOSIS — L2989 Other pruritus: Secondary | ICD-10-CM

## 2024-01-01 DIAGNOSIS — J4540 Moderate persistent asthma, uncomplicated: Secondary | ICD-10-CM

## 2024-01-01 DIAGNOSIS — I890 Lymphedema, not elsewhere classified: Secondary | ICD-10-CM | POA: Diagnosis not present

## 2024-01-01 DIAGNOSIS — W57XXXA Bitten or stung by nonvenomous insect and other nonvenomous arthropods, initial encounter: Secondary | ICD-10-CM

## 2024-01-01 MED ORDER — FAMOTIDINE 20 MG PO TABS: 20.0000 mg | ORAL_TABLET | Freq: Two times a day (BID) | ORAL | 1 refills | Status: AC

## 2024-01-01 MED ORDER — CLOBETASOL PROPIONATE 0.05 % EX CREA: 1.0000 | TOPICAL_CREAM | Freq: Two times a day (BID) | CUTANEOUS | 3 refills | Status: DC

## 2024-01-01 NOTE — Patient Instructions (Signed)
 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 Previous treatments with systemic steroids, Claritin , Pepcid , and Xyzal showed limited success. No anaphylaxis risk. Focus on symptom management due to lack of cure. Consider Xolair for chronic itch if high-dose antihistamines fail. Avoid systemic steroids due to risks  - Prescribe clobetasol  0.5% cream, apply twice daily on bites. - Increase Xyzal 5mg  to four times daily. - Add famotidine  (Pepcid ) 20mg  twice daily  to regimen. - Continue avoidance measures  - Reassess in 4-6 weeks to evaluate treatment effectiveness and consider Xolair if necessary.  Follow up: 4-6 weeks   Thank you so much for letting me partake in your care today.  Don't hesitate to reach out if you have any additional concerns!  Hargis Springer, MD  [facility] [place], [place]

## 2024-01-01 NOTE — Progress Notes (Signed)
 NEW PATIENT Date of Service/Encounter:  01/01/24 Referring provider: [facility] Primary care provider: [facility] [place] [facility]  Subjective:  Sonya Mitchell is a 58 y.o. female  presenting today for evaluation of bug bites  History obtained from: chart review and patient.   Discussed the use of AI scribe software for clinical note transcription with the patient, who gave verbal consent to proceed.  History of Present Illness Sonya Mitchell is a 58 year old female who presents with persistent itching and allergic reactions due to recurrent mosquito bites. She was referred by Dr. LEDELL for evaluation of her persistent mosquito bites and associated symptoms.  Pruritus and allergic reactions secondary to mosquito bites - Persistent and recurrent mosquito bites resulting in significant pruritus and allergic reactions - Multiple urgent care visits in May and June, and an emergency room visit on May 15th for symptom management - Received multiple steroid injections throughout May for symptom control - Initial treatment with Claritin  and Pepcid , later escalated to Xyzal twice daily without adequate symptom relief - Daily bites affecting fingers, arms, and other exposed areas, even when using recommended oils mixed with lotions - Protective measures including protective clothing, organic repellents, and all available store products (including Deep) have been ineffective - Topical creams used without significant benefit; current cream left in her truck and not available at the appointment - Referred by pulmonary for possible biologic use  Environmental exposure and activity-related exacerbation - Frequent outdoor exposure due 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, attending doctor's appointments, and physical therapy - Continued mosquito exposure despite use of protective measures  Lymphedema and associated symptoms - History of lymphedema attributed to prior residence in a mold-infested  environment for one year - Associated muscle spasms and general decline in health since onset of lymphedema  Asthma and respiratory symptoms - Managed by pulmonary   Insect identification and exclusion of other causes - Confirms bites are from mosquitoes and not bed bugs or bird mites, based on prior experience in [place]     Chart Review:  Pulm 11/28/23:  UC visits: 5/18, 5/29, 6/17, 6/27: Medrol INJECTION/ steroid DP given on 11/05/23 and 10/10/23.  ED visit 5/17: bug bites, given claritin , pepcid     Past Medical History: Past Medical History:  Diagnosis Date   Anxiety disorder    by pt report   Asthma    Chronic headaches    by pt report   History of panic attacks    by pt report   Medication List:  Current Outpatient Medications  Medication Sig Dispense Refill   clobetasol  cream (TEMOVATE ) 0.05 % Apply 1 Application topically 2 (two) times daily. 60 g 3   famotidine  (PEPCID ) 20 MG tablet Take 1 tablet (20 mg total) by mouth 2 (two) times daily. 60 tablet 1   albuterol  (PROVENTIL  HFA;VENTOLIN  HFA) 108 (90 Base) MCG/ACT inhaler Inhale 2 puffs into the lungs every 6 (six) hours as needed for wheezing or shortness of breath.     aspirin 81 MG tablet Take 81 mg by mouth daily.     beclomethasone (QVAR) 80 MCG/ACT inhaler Inhale 2 puffs into the lungs 2 (two) times daily.     budesonide (RHINOCORT ALLERGY) 32 MCG/ACT nasal spray Place 1 spray into both nostrils daily.     Butalbital-APAP-Caffeine 50-300-40 MG CAPS Take 1-2 tablets by mouth daily as needed (headaches).      cetirizine (ZYRTEC) 10 MG tablet Take 10 mg by mouth daily as needed for allergies.  cyclobenzaprine  (FLEXERIL ) 10 MG tablet Take 0.5-1 tablets (5-10 mg total) by mouth at bedtime as needed for muscle spasms. 10 tablet 0   EPIPEN  2-PAK 0.3 MG/0.3ML SOAJ injection USE FOR SEVERE ALLERGIC REACTION 2 Device 0   fluticasone (FLONASE) 50 MCG/ACT nasal spray Place 1 spray into both nostrils daily.  3    montelukast (SINGULAIR) 10 MG tablet Take 10 mg by mouth daily.  0   Multiple Vitamins-Minerals (HAIR SKIN AND NAILS FORMULA PO) Take 1 tablet by mouth daily.     naproxen  (NAPROSYN ) 375 MG tablet Take 1 tablet twice daily as needed for chest wall pain. 20 tablet 0   pantoprazole  (PROTONIX ) 40 MG tablet Take 1 tablet (40 mg total) by mouth daily. 30 tablet 0   predniSONE  (DELTASONE ) 20 MG tablet Take 2 tablets (40 mg total) by mouth daily with breakfast. For the next four days 8 tablet 0   No current facility-administered medications for this visit.   Known Allergies:  Allergies  Allergen Reactions   Bee Venom Shortness Of Breath and Swelling    Redness.    Dicyclomine Shortness Of Breath   Pollen Extract     Dust    Past Surgical History: Past Surgical History:  Procedure Laterality Date   ABLATION     Family History: Family History  Problem Relation Age of Onset   Diabetes Mother    Asthma Mother    Stroke Maternal Grandfather    Asthma Son    Seizures Daughter    Social History: Sonya Mitchell lives single-family home, will no water damage.  Carpet throughout.  No pets in the house.  No roaches in the house and bed is to be off floor.  Dust mite precautions in bed and pillows.  Cigarette and vape exposure inside the car.  She actively vapes CBD and smokes marijuana.  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  She has special needs twins..   ROS:  All other systems negative except as noted per HPI.  Objective:  Blood pressure 132/76, pulse 94, temperature 98.1 F (36.7 C), temperature source Temporal, resp. rate 20, height 5' 5 (1.651 m), weight 263 lb 9.6 oz (119.6 kg), SpO2 99%. Body mass index is 43.87 kg/m. Physical Exam:  General Appearance:  Alert, cooperative, no distress, appears stated age  Head:  Normocephalic, without obvious abnormality, atraumatic  Eyes:  Conjunctiva clear, EOM's intact  Ears EACs normal bilaterally  Nose: Nares normal, normal mucosa, no visible anterior polyps,  and septum midline  Throat: Lips, tongue normal; teeth and gums normal, normal posterior oropharynx  Neck: Supple, symmetrical  Lungs:   clear to auscultation bilaterally, Respirations unlabored, no coughing  Heart:  regular rate and rhythm and no murmur, Appears well perfused  Extremities: No edema  Skin: Scratching , Skin color, texture, turgor normal, and no rashes or lesions on visualized portions of skin  Neurologic: No gross deficits   Diagnostics: None done    Labs:  Lab Orders  No laboratory test(s) ordered today     Assessment and Plan  Assessment and Plan Assessment & Plan 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 Previous treatments with systemic steroids, Claritin , Pepcid , and Xyzal showed limited success. No anaphylaxis risk. Focus on symptom management due to lack of cure. Consider Xolair for chronic itch if high-dose antihistamines fail. Avoid systemic steroids due to risks of immune suppression, bone loss, adrenal insufficiency, and cataracts. - Prescribe clobetasol  0.5% cream, apply twice daily on bites. - Increase Xyzal 5mg  to  four times daily. - Add famotidine  (Pepcid ) 20mg  twice daily  to regimen. - Continue avoidance measures  - Reassess in 4-6 weeks to evaluate treatment effectiveness and consider Xolair if necessary.      This note in its entirety was forwarded to the Provider who requested this consultation.  Other:    Thank you for your kind referral. I appreciate the opportunity to take part in Laddie's care. Please do not hesitate to contact me with questions.  Sincerely,  Thank you so much for letting me partake in your care today.  Don't hesitate to reach out if you have any additional concerns!  Hargis Springer, MD  [facility] [place], [place]

## 2024-02-14 ENCOUNTER — Ambulatory Visit (INDEPENDENT_AMBULATORY_CARE_PROVIDER_SITE_OTHER): Admitting: Internal Medicine

## 2024-02-14 ENCOUNTER — Other Ambulatory Visit: Payer: Self-pay

## 2024-02-14 ENCOUNTER — Ambulatory Visit: Admitting: Internal Medicine

## 2024-02-14 VITALS — BP 128/86 | HR 81 | Temp 98.6°F | Resp 20 | Ht 65.0 in | Wt 269.4 lb

## 2024-02-14 DIAGNOSIS — J4540 Moderate persistent asthma, uncomplicated: Secondary | ICD-10-CM

## 2024-02-14 DIAGNOSIS — W57XXXD Bitten or stung by nonvenomous insect and other nonvenomous arthropods, subsequent encounter: Secondary | ICD-10-CM | POA: Diagnosis not present

## 2024-02-14 DIAGNOSIS — J4550 Severe persistent asthma, uncomplicated: Secondary | ICD-10-CM

## 2024-02-14 DIAGNOSIS — L2389 Allergic contact dermatitis due to other agents: Secondary | ICD-10-CM

## 2024-02-14 DIAGNOSIS — L281 Prurigo nodularis: Secondary | ICD-10-CM | POA: Diagnosis not present

## 2024-02-14 MED ORDER — DUPILUMAB 300 MG/2ML ~~LOC~~ SOSY
600.0000 mg | PREFILLED_SYRINGE | Freq: Once | SUBCUTANEOUS | Status: AC
  Administered 2024-02-14: 600 mg via SUBCUTANEOUS

## 2024-02-14 NOTE — Progress Notes (Signed)
 FOLLOW UP Date of Service/Encounter:  02/14/24  Subjective:  Sonya Mitchell (DOB: 01-15-1966) is a 58 y.o. female who [facility] in re-evaluation of the following: Skeeter syndrome, contact dermatitis, prurigo nodularis History obtained from: chart review and patient.  For Review, LV was on 02/01/2024 with Dr. Lorin seen for intial visit for recurrent severe reactions to mosquito bites, prurigo nodularis. See below for summary of history and diagnostics.   Today presents for follow-up. Discussed the use of AI scribe software for clinical note transcription with the patient, who gave verbal consent to proceed.  History of Present Illness Sonya Mitchell is a 58 year old female with chronic hives and contact dermatitis who presents with a severe allergic reaction and chronic itch.  Acute allergic reaction - Severe allergic reaction following application of a lubricant on scalp and chest where leads were attached during a sleep study - Facial swelling and rash at all application sites, starting the next day - Required emergency room visit and received steroid injection for symptom relief - Rash observed by emergency department staff  Chronic pruritus with skin lesions - Chronic hives with persistent pruritus - No improvement in itch with Xyzal 4 times a day, clobetasol , Pepcid  twice a day - Chronic itch persists despite previous treatments - Now with numerous hyperpigmented nodules on arms and legs from scratching  Respiratory symptoms and sleep apnea - History of sleep apnea with frequent apneic episodes during sleep since childhood - Subconscious breath-holding while awake - Uses multiple inhalers for breathing issues, including Symbicort and a rescue inhaler as needed, especially on days with severe symptoms - Difficulty with full exhalation - No change in baseline breathing symptoms   All medications reviewed by clinical staff and  updated in chart. No new pertinent medical or surgical history except as noted in HPI.  ROS: All others negative except as noted per HPI.   Objective:  BP 128/86   Pulse 81   Temp 98.6 F (37 C) (Temporal)   Resp 20   Ht 5' 5 (1.651 m)   Wt 269 lb 6.4 oz (122.2 kg)   SpO2 100%   BMI 44.83 kg/m  Body mass index is 44.83 kg/m. Physical Exam: General Appearance:  Alert, cooperative, no distress, appears stated age  Head:  Normocephalic, without obvious abnormality, atraumatic  Eyes:  Conjunctiva clear, EOM's intact  Ears EACs normal bilaterally  Nose: Nares normal, no rhinorrhea  Throat: Lips, tongue normal; teeth and gums normal, moist mucous membranes  Neck: Supple, symmetrical  Lungs:   clear to auscultation bilaterally, Respirations unlabored, no coughing  Heart:  regular rate and rhythm and no murmur, Appears well perfused  Extremities: No edema  Skin: Hyperpigmented nodules excoriation marks on arms and Skin color, texture, turgor normal  Neurologic: No gross deficits   Labs:  Lab Orders  No laboratory test(s) ordered today     Assessment/Plan   Patient Instructions  Skeeter syndrome with Prurigo nodularis Recurrent mosquito bites causing local allergic reactions with PN. SABRA Previous treatments with systemic steroids, Claritin , Pepcid , and Xyzal showed limited success. Multiple ED visit  - Continue  clobetasol  0.5% cream, apply twice daily on bites. - continue Xyzal 5mg  to four times daily. - Continue  famotidine  (Pepcid ) 20mg  twice daily  to regimen. - Start dupixent , loading dose of 600mg  given today, informed consent obtained today   -dose will be 300mg  every 2 weeks   -Tammy our biologic coordinator will  be reaching out to you in regards to approval process    Contact Dermatitis  - Avoid using same lubricant for subsequent sleep studies   Moderate Persistent Asthma with OSA  - Will update breathing test at next visit - Controller Inhaler: Continue  Symbicort 160 mcg 2 puffs twice a day; This Should Be Used Everyday - Rinse mouth out after use - Rescue Inhaler: Albuterol  (Proair /Ventolin ) 2 puffs . Use  every 4-6 hours as needed for chest tightness, wheezing, or coughing.  Can also use 15 minutes prior to exercise if you have symptoms with activity. - Asthma is not controlled if:  - Symptoms are occurring >2 times a week OR  - >2 times a month nighttime awakenings  - You are requiring systemic steroids (prednisone /steroid injections) more than once per year  - Your require hospitalization for your asthma.  - Please call the clinic to schedule a follow up if these symptoms arise   Follow up: 3 months   Thank you so much for letting me partake in your care today.  Don't hesitate to reach out if you have any additional concerns!  Hargis Springer, MD  [facility] [place], [place]  Other:    Thank you so much for letting me partake in your care today.  Don't hesitate to reach out if you have any additional concerns!  Hargis Springer, MD  [facility] [place], [place]

## 2024-02-14 NOTE — Progress Notes (Signed)
 Immunotherapy   Patient Details  Name: Sonya Mitchell MRN: [id number] Date of Birth: 1966-02-27  02/14/2024  Melvina OLEGARIO Sous started loading dose of dupixent  600 mg/2 ml in office Following schedule: N/A Frequency:every 2 weeks Epi-Pen:Epi-Pen Available  Consent signed and patient instructions given.   Corean Domino 02/14/2024, 12:28 PM

## 2024-02-14 NOTE — Patient Instructions (Addendum)
 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 Sonya Mitchell Previous treatments with systemic steroids, Claritin , Pepcid , and Xyzal showed limited success. Multiple ED visit  - Continue  clobetasol  0.5% cream, apply twice daily on bites. - continue Xyzal 5mg  to four times daily. - Continue  famotidine  (Pepcid ) 20mg  twice daily  to regimen. - Start dupixent , loading dose of 600mg  given today, informed consent obtained today   -dose will be 300mg  every 2 weeks   -Tammy our biologic coordinator will be reaching out to you in regards to approval process    Contact Dermatitis  - Avoid using same lubricant for subsequent sleep studies   Moderate Persistent Asthma with OSA  - Will update breathing test at next visit - Controller Inhaler: Continue Symbicort 160 mcg 2 puffs twice a day; This Should Be Used Everyday - Rinse mouth out after use - Rescue Inhaler: Albuterol  (Proair /Ventolin ) 2 puffs . Use  every 4-6 hours as needed for chest tightness, wheezing, or coughing.  Can also use 15 minutes prior to exercise if you have symptoms with activity. - Asthma is not controlled if:  - Symptoms are occurring >2 times a week OR  - >2 times a month nighttime awakenings  - You are requiring systemic steroids (prednisone /steroid injections) more than once per year  - Your require hospitalization for your asthma.  - Please call the clinic to schedule a follow up if these symptoms arise   Follow up: 3 months   Thank you so much for letting me partake in your care today.  Don't hesitate to reach out if you have any additional concerns!  Sonya Springer, MD  [facility] [place], [place]

## 2024-02-15 ENCOUNTER — Telehealth: Payer: Self-pay | Admitting: *Deleted

## 2024-02-15 MED ORDER — DUPIXENT 300 MG/2ML ~~LOC~~ SOSY
300.0000 mg | PREFILLED_SYRINGE | SUBCUTANEOUS | 11 refills | Status: AC
  Filled 2024-02-16: qty 4, 28d supply, fill #0
  Filled 2024-03-12 – 2024-03-14 (×2): qty 4, 28d supply, fill #1
  Filled 2024-04-10: qty 4, 28d supply, fill #2
  Filled 2024-05-07 – 2024-05-13 (×2): qty 4, 28d supply, fill #3
  Filled 2024-05-31 – 2024-06-03 (×2): qty 4, 28d supply, fill #4
  Filled 2024-06-26: qty 4, 28d supply, fill #5
  Filled 2024-07-22: qty 4, 28d supply, fill #6

## 2024-02-15 NOTE — Telephone Encounter (Signed)
L/m for patient to contact me to advise approval and submit to [facility] for Dupixent

## 2024-02-15 NOTE — Telephone Encounter (Signed)
-----   Message from Hargis Springer sent at 02/14/2024  1:14 PM EDT ----- Can we start this patient on Dupixent  300 mg every 2 weeks for prurigo nodularis.  Has failed clobetasol , multiple systemic steroids, Xyzal 4 times a day, Pepcid

## 2024-02-15 NOTE — Telephone Encounter (Signed)
 Spoke to patient and advised submit to Sonya Mitchell and will reach out once delivery set to make appt for next injection in clinic since she wants admin in clinic

## 2024-02-16 ENCOUNTER — Other Ambulatory Visit: Payer: Self-pay

## 2024-02-16 NOTE — Progress Notes (Signed)
 Specialty Pharmacy Initial Fill Coordination Note  Sonya Mitchell is a 58 y.o. female contacted today regarding initial fill of specialty medication(s) Dupilumab  (Dupixent )   Patient requested Courier to Provider Office   Delivery date: 02/26/24   Verified address: [address]   Medication will be filled on 9/5.   Patient is aware of $4 copayment.

## 2024-02-16 NOTE — Progress Notes (Signed)
 Specialty Pharmacy Initiation Note   Sonya Mitchell is a 58 y.o. female who will be followed by the specialty pharmacy service for RxSp Allergy    Review of administration, indication, effectiveness, safety, potential side effects, storage/disposable, and missed dose instructions occurred today for patient's specialty medication(s) Dupixent    Patient/Caregiver did not have any additional questions or concerns.   Patient's therapy is appropriate to: Initiate    Goals Addressed             This Visit's Progress    Minimize recurrence of flares       Patient is initiating therapy. Patient will maintain adherence and avoid flare triggers         [name] Specialty Pharmacist

## 2024-02-20 ENCOUNTER — Other Ambulatory Visit: Payer: Self-pay

## 2024-02-20 ENCOUNTER — Ambulatory Visit (INDEPENDENT_AMBULATORY_CARE_PROVIDER_SITE_OTHER)

## 2024-02-20 DIAGNOSIS — L281 Prurigo nodularis: Secondary | ICD-10-CM

## 2024-02-20 MED ORDER — DUPILUMAB 300 MG/2ML ~~LOC~~ SOSY
300.0000 mg | PREFILLED_SYRINGE | SUBCUTANEOUS | Status: AC
  Administered 2024-02-20 – 2024-07-17 (×11): 300 mg via SUBCUTANEOUS

## 2024-02-20 NOTE — Progress Notes (Signed)
 Patient's appointment was 9/02, the pharmacy thought it was 9/09.  Spoke to office staff, they will reschedule, now filling 9/02 to courier to office on 9/03.

## 2024-03-06 ENCOUNTER — Emergency Department (HOSPITAL_BASED_OUTPATIENT_CLINIC_OR_DEPARTMENT_OTHER)

## 2024-03-06 ENCOUNTER — Encounter (HOSPITAL_BASED_OUTPATIENT_CLINIC_OR_DEPARTMENT_OTHER): Payer: Self-pay | Admitting: Emergency Medicine

## 2024-03-06 ENCOUNTER — Other Ambulatory Visit: Payer: Self-pay

## 2024-03-06 ENCOUNTER — Ambulatory Visit (INDEPENDENT_AMBULATORY_CARE_PROVIDER_SITE_OTHER)

## 2024-03-06 ENCOUNTER — Emergency Department (HOSPITAL_BASED_OUTPATIENT_CLINIC_OR_DEPARTMENT_OTHER)
Admission: EM | Admit: 2024-03-06 | Discharge: 2024-03-06 | Disposition: A | Discharge status: Home / Self Care | Attending: Emergency Medicine | Admitting: Emergency Medicine

## 2024-03-06 DIAGNOSIS — S93402A Sprain of unspecified ligament of left ankle, initial encounter: Secondary | ICD-10-CM | POA: Diagnosis not present

## 2024-03-06 DIAGNOSIS — S99922A Unspecified injury of left foot, initial encounter: Secondary | ICD-10-CM | POA: Diagnosis present

## 2024-03-06 DIAGNOSIS — S92342A Displaced fracture of fourth metatarsal bone, left foot, initial encounter for closed fracture: Secondary | ICD-10-CM | POA: Insufficient documentation

## 2024-03-06 DIAGNOSIS — S92332A Displaced fracture of third metatarsal bone, left foot, initial encounter for closed fracture: Secondary | ICD-10-CM | POA: Insufficient documentation

## 2024-03-06 DIAGNOSIS — W01198A Fall on same level from slipping, tripping and stumbling with subsequent striking against other object, initial encounter: Secondary | ICD-10-CM | POA: Insufficient documentation

## 2024-03-06 DIAGNOSIS — L281 Prurigo nodularis: Secondary | ICD-10-CM | POA: Diagnosis not present

## 2024-03-06 DIAGNOSIS — Z7982 Long term (current) use of aspirin: Secondary | ICD-10-CM | POA: Insufficient documentation

## 2024-03-06 DIAGNOSIS — S92309A Fracture of unspecified metatarsal bone(s), unspecified foot, initial encounter for closed fracture: Secondary | ICD-10-CM

## 2024-03-06 MED ORDER — OXYCODONE-ACETAMINOPHEN 5-325 MG PO TABS
1.0000 | ORAL_TABLET | Freq: Once | ORAL | Status: AC
  Administered 2024-03-06: 1 via ORAL
  Filled 2024-03-06: qty 1

## 2024-03-06 MED ORDER — IBUPROFEN 600 MG PO TABS: 600.0000 mg | ORAL_TABLET | Freq: Four times a day (QID) | ORAL | 0 refills | Status: AC | PRN

## 2024-03-06 MED ORDER — KETOROLAC TROMETHAMINE 15 MG/ML IJ SOLN
15.0000 mg | Freq: Once | INTRAMUSCULAR | Status: AC
  Administered 2024-03-06: 15 mg via INTRAMUSCULAR
  Filled 2024-03-06: qty 1

## 2024-03-06 MED ORDER — ACETAMINOPHEN 325 MG PO TABS: 650.0000 mg | ORAL_TABLET | Freq: Four times a day (QID) | ORAL | 0 refills | Status: AC | PRN

## 2024-03-06 MED ORDER — OXYCODONE HCL 5 MG PO TABS: 5.0000 mg | ORAL_TABLET | ORAL | 0 refills | Status: AC | PRN

## 2024-03-06 NOTE — ED Provider Notes (Signed)
 [place] [facility] Provider Note   CSN: [id number] Arrival date & time: 03/06/24  1819     Patient presents with: Sonya Mitchell is a 58 y.o. female.   Patient to ED with complaint of left lower extremity pain after losing her balance and falling earlier today. She reports predominant pain in the foot extending up the leg to the knee. No other injury. No open wound. Not anticoagulated.   Fall       Prior to Admission medications   Medication Sig Start Date End Date Taking? Authorizing Provider  albuterol  (PROVENTIL  HFA;VENTOLIN  HFA) 108 (90 Base) MCG/ACT inhaler Inhale 2 puffs into the lungs every 6 (six) hours as needed for wheezing or shortness of breath.    [provider]  aspirin 81 MG tablet Take 81 mg by mouth daily.    [provider]  beclomethasone (QVAR) 80 MCG/ACT inhaler Inhale 2 puffs into the lungs 2 (two) times daily.    [provider]  budesonide (RHINOCORT ALLERGY) 32 MCG/ACT nasal spray Place 1 spray into both nostrils daily.    [provider]  Butalbital-APAP-Caffeine 50-300-40 MG CAPS Take 1-2 tablets by mouth daily as needed (headaches).  05/22/14   [provider]  cetirizine (ZYRTEC) 10 MG tablet Take 10 mg by mouth daily as needed for allergies.     [provider]  clobetasol  cream (TEMOVATE ) 0.05 % Apply 1 Application topically 2 (two) times daily. 01/01/24   Lorin Norris, MD  cyclobenzaprine  (FLEXERIL ) 10 MG tablet Take 0.5-1 tablets (5-10 mg total) by mouth at bedtime as needed for muscle spasms. 10/01/23   Franaszek, Amanda, PA-C  dupilumab  (DUPIXENT ) 300 MG/2ML prefilled syringe Inject 300 mg into the skin every 14 (fourteen) days. 02/15/24   Lorin Norris, MD  EPIPEN  2-PAK 0.3 MG/0.3ML SOAJ injection USE FOR SEVERE ALLERGIC REACTION 10/23/15   Bobbitt, Elgin Pepper, MD  famotidine  (PEPCID ) 20 MG tablet Take 1 tablet (20 mg total) by mouth 2 (two)  times daily. 01/01/24   Lomasney, Evelyn, MD  fluticasone (FLONASE) 50 MCG/ACT nasal spray Place 1 spray into both nostrils daily. 10/17/17   [provider]  montelukast (SINGULAIR) 10 MG tablet Take 10 mg by mouth daily. 12/23/14   [provider]  Multiple Vitamins-Minerals (HAIR SKIN AND NAILS FORMULA PO) Take 1 tablet by mouth daily.    [provider]  naproxen  (NAPROSYN ) 375 MG tablet Take 1 tablet twice daily as needed for chest wall pain. 06/29/22   Molpus, John, MD  pantoprazole  (PROTONIX ) 40 MG tablet Take 1 tablet (40 mg total) by mouth daily. 06/29/22   Molpus, John, MD  predniSONE  (DELTASONE ) 20 MG tablet Take 2 tablets (40 mg total) by mouth daily with breakfast. For the next four days 04/05/18   Garrick Charleston, MD    Allergies: Bee venom, Dicyclomine, and Pollen extract    Review of Systems  Updated Vital Signs BP (!) 142/75   Pulse 80   Temp 98.9 F (37.2 C)   Resp 16   Ht 5' 5 (1.651 m)   Wt 122.2 kg   SpO2 100%   BMI 44.83 kg/m   Physical Exam Vitals and nursing note reviewed.  Constitutional:      General: She is not in acute distress.    Appearance: She is well-developed. She is not ill-appearing.  Pulmonary:     Effort: Pulmonary effort is normal.  Musculoskeletal:  General: Normal range of motion.     Cervical back: Normal range of motion.     Comments: Legs are swollen bilaterally, c/w h/o lymphedema. No wound or bleeding. Left foot is markedly tender to palpation. No deformity. Tenderness to anterior knee without significant swelling.   Skin:    General: Skin is warm and dry.  Neurological:     Mental Status: She is alert and oriented to person, place, and time.     (all labs ordered are listed, but only abnormal results are displayed) Labs Reviewed - No data to display  EKG: None  Radiology: CT Foot Left Wo Contrast Result Date: 03/06/2024 CLINICAL DATA:  Recent fall and left ankle pain. EXAM: CT OF THE LEFT  FOOT WITHOUT CONTRAST TECHNIQUE: Multidetector CT imaging of the left foot was performed according to the standard protocol. Multiplanar CT image reconstructions were also generated. RADIATION DOSE REDUCTION: This exam was performed according to the departmental dose-optimization program which includes automated exposure control, adjustment of the mA and/or kV according to patient size and/or use of iterative reconstruction technique. COMPARISON:  Radiograph dated 03/06/2024. FINDINGS: Bones/Joint/Cartilage Evaluation is limited due to positioning of the foot. There is a nondisplaced fracture of the proximal second metatarsal. Mildly displaced intra-articular fractures of the bases of the third and fourth metatarsals. No dislocation. Ligaments Suboptimally assessed by CT. Muscles and Tendons No intramuscular fluid collection. Soft tissues Mild soft tissue swelling. No fluid collection or hematoma. IMPRESSION: 1. Nondisplaced fracture of the proximal second metatarsal. 2. Mildly displaced intra-articular fractures of the bases of the third and fourth metatarsals. Electronically Signed   By: Vanetta Chou M.D.   On: 03/06/2024 21:19   DG Foot Complete Left Result Date: 03/06/2024 CLINICAL DATA:  Status post fall EXAM: LEFT FOOT - COMPLETE 3+ VIEW COMPARISON:  None Available. FINDINGS: Mildly displaced fractures involving the base of the third and fourth metatarsal base and possibly along the second metatarsal base. Soft tissue swelling is identified throughout the leg. IMPRESSION: Metatarsal fractures with mild displacement. Recommend CT for further assessment if clinically warranted. Electronically Signed   By: Megan  Zare M.D.   On: 03/06/2024 19:59   DG Ankle Complete Left Result Date: 03/06/2024 CLINICAL DATA:  Recent fall with left ankle pain, initial encounter EXAM: LEFT ANKLE COMPLETE - 3+ VIEW COMPARISON:  None Available. FINDINGS: There is no evidence of fracture, dislocation, or joint effusion.  There is no evidence of arthropathy or other focal bone abnormality. Soft tissues are unremarkable. Small accessory ossicle is noted laterally. IMPRESSION: No acute abnormality noted. Electronically Signed   By: Oneil Devonshire M.D.   On: 03/06/2024 19:59   DG Knee Complete 4 Views Left Result Date: 03/06/2024 CLINICAL DATA:  Recent fall with left knee pain, initial encounter EXAM: LEFT KNEE - COMPLETE 4+ VIEW COMPARISON:  None Available. FINDINGS: No evidence of fracture, dislocation, or joint effusion. No evidence of arthropathy or other focal bone abnormality. Soft tissues are unremarkable. IMPRESSION: No acute abnormality noted Electronically Signed   By: Oneil Devonshire M.D.   On: 03/06/2024 19:58     Procedures   Medications Ordered in the ED  oxyCODONE -acetaminophen  (PERCOCET/ROXICET) 5-325 MG per tablet 1 tablet (1 tablet Oral Given 03/06/24 2027)  ketorolac  (TORADOL ) 15 MG/ML injection 15 mg (15 mg Intramuscular Given 03/06/24 2110)    Clinical Course as of 03/06/24 2135  Wed Mar 06, 2024  2133 Imaging is positive for multiple MT fractures, base of 2nd, 3rd, 4th. CT recommended by radiology.  CT pending at time of sign out to Dr. Elnor for appropriate management.  [SU]    Clinical Course User Index [SU] Odell Balls, PA-C                                 Medical Decision Making Amount and/or Complexity of Data Reviewed Radiology: ordered.  Risk Prescription drug management.        Final diagnoses:  Closed fracture of multiple metatarsal bones    ED Discharge Orders     None          Odell Balls, PA-C 03/06/24 2135    Elnor Savant A, DO 03/06/24 2220

## 2024-03-06 NOTE — Discharge Instructions (Addendum)
 It was a pleasure caring for you today in the emergency department.  Please call orthopedics to arrange follow-up.  Please keep your left leg elevated to help reduce swelling associated with the injury.  Please return to the emergency department for any worsening or worrisome symptoms.

## 2024-03-06 NOTE — ED Triage Notes (Signed)
 Pt reports falling on uneven terrain and hit her LT knee and LT ankle and foot pain that radiates up the LT leg

## 2024-03-10 ENCOUNTER — Encounter (HOSPITAL_BASED_OUTPATIENT_CLINIC_OR_DEPARTMENT_OTHER): Payer: Self-pay | Admitting: Emergency Medicine

## 2024-03-10 ENCOUNTER — Emergency Department (HOSPITAL_BASED_OUTPATIENT_CLINIC_OR_DEPARTMENT_OTHER)
Admission: EM | Admit: 2024-03-10 | Discharge: 2024-03-10 | Disposition: A | Discharge status: Home / Self Care | Attending: Emergency Medicine | Admitting: Emergency Medicine

## 2024-03-10 DIAGNOSIS — X58XXXD Exposure to other specified factors, subsequent encounter: Secondary | ICD-10-CM | POA: Insufficient documentation

## 2024-03-10 DIAGNOSIS — S92322D Displaced fracture of second metatarsal bone, left foot, subsequent encounter for fracture with routine healing: Secondary | ICD-10-CM | POA: Insufficient documentation

## 2024-03-10 DIAGNOSIS — S92335D Nondisplaced fracture of third metatarsal bone, left foot, subsequent encounter for fracture with routine healing: Secondary | ICD-10-CM | POA: Insufficient documentation

## 2024-03-10 DIAGNOSIS — S92345D Nondisplaced fracture of fourth metatarsal bone, left foot, subsequent encounter for fracture with routine healing: Secondary | ICD-10-CM | POA: Insufficient documentation

## 2024-03-10 DIAGNOSIS — Z7951 Long term (current) use of inhaled steroids: Secondary | ICD-10-CM | POA: Diagnosis not present

## 2024-03-10 DIAGNOSIS — J45909 Unspecified asthma, uncomplicated: Secondary | ICD-10-CM | POA: Diagnosis not present

## 2024-03-10 DIAGNOSIS — Z7982 Long term (current) use of aspirin: Secondary | ICD-10-CM | POA: Diagnosis not present

## 2024-03-10 DIAGNOSIS — S99922A Unspecified injury of left foot, initial encounter: Secondary | ICD-10-CM | POA: Diagnosis present

## 2024-03-10 MED ORDER — KETOROLAC TROMETHAMINE 15 MG/ML IJ SOLN
15.0000 mg | Freq: Once | INTRAMUSCULAR | Status: AC
  Administered 2024-03-10: 15 mg via INTRAMUSCULAR
  Filled 2024-03-10: qty 1

## 2024-03-10 NOTE — ED Triage Notes (Signed)
 Pt c/o continued LT foot pain and swelling; dx with metatarsal fxs 9/17

## 2024-03-10 NOTE — ED Provider Notes (Signed)
 [place] EMERGENCY DEPARTMENT AT [facility] [place] Provider Note   CSN: [id number] Arrival date & time: 03/10/24  1711     Patient presents with: Foot Pain   Sonya Mitchell is a 58 y.o. female presents with complaints of continued left foot pain and swelling.  Patient was evaluated here on 9/17 for similar complaints found to have a nondisplaced fracture of second metatarsal with mildly displaced intra-articular fractures of the 3rd and 4th metatarsals.  She was followed up with orthopedics who recommended surgery next week.  Patient reports delay as her daughters are both special needs and she is having logistical issues.  She denies any new injury or trauma.  She has been using oxycodone , Tylenol  ibuprofen  without significant improvement.  She has been nonweightbearing.    Foot Pain   Past Medical History:  Diagnosis Date   Anxiety disorder    by pt report   Asthma    Chronic headaches    by pt report   History of panic attacks    by pt report   Past Surgical History:  Procedure Laterality Date   ABLATION         Prior to Admission medications   Medication Sig Start Date End Date Taking? Authorizing Provider  acetaminophen  (TYLENOL ) 325 MG tablet Take 2 tablets (650 mg total) by mouth every 6 (six) hours as needed. 03/06/24   Elnor Jayson LABOR, DO  albuterol  (PROVENTIL  HFA;VENTOLIN  HFA) 108 (90 Base) MCG/ACT inhaler Inhale 2 puffs into the lungs every 6 (six) hours as needed for wheezing or shortness of breath.    [provider]  aspirin 81 MG tablet Take 81 mg by mouth daily.    [provider]  beclomethasone (QVAR) 80 MCG/ACT inhaler Inhale 2 puffs into the lungs 2 (two) times daily.    [provider]  budesonide (RHINOCORT ALLERGY) 32 MCG/ACT nasal spray Place 1 spray into both nostrils daily.    [provider]  Butalbital-APAP-Caffeine 50-300-40 MG CAPS Take 1-2 tablets by mouth daily as needed (headaches).  05/22/14   [provider]  cetirizine (ZYRTEC) 10 MG tablet Take 10 mg by mouth daily as needed for allergies.     [provider]  clobetasol  cream (TEMOVATE ) 0.05 % Apply 1 Application topically 2 (two) times daily. 01/01/24   Lorin Norris, MD  cyclobenzaprine  (FLEXERIL ) 10 MG tablet Take 0.5-1 tablets (5-10 mg total) by mouth at bedtime as needed for muscle spasms. 10/01/23   Veta Palma, PA-C  dupilumab  (DUPIXENT ) 300 MG/2ML prefilled syringe Inject 300 mg into the skin every 14 (fourteen) days. 02/15/24   Lorin Norris, MD  EPIPEN  2-PAK 0.3 MG/0.3ML SOAJ injection USE FOR SEVERE ALLERGIC REACTION 10/23/15   Bobbitt, Elgin Pepper, MD  famotidine  (PEPCID ) 20 MG tablet Take 1 tablet (20 mg total) by mouth 2 (two) times daily. 01/01/24   Lomasney, Evelyn, MD  fluticasone (FLONASE) 50 MCG/ACT nasal spray Place 1 spray into both nostrils daily. 10/17/17   [provider]  ibuprofen  (ADVIL ) 600 MG tablet Take 1 tablet (600 mg total) by mouth every 6 (six) hours as needed. 03/06/24   Elnor Jayson LABOR, DO  montelukast (SINGULAIR) 10 MG tablet Take 10 mg by mouth daily. 12/23/14   [provider]  Multiple Vitamins-Minerals (HAIR SKIN AND NAILS FORMULA PO) Take 1 tablet by mouth daily.    [provider]  oxyCODONE  (ROXICODONE ) 5 MG immediate release tablet Take 1 tablet (5 mg total) by mouth every 4 (  four) hours as needed for severe pain (pain score 7-10) or breakthrough pain. 03/06/24   Elnor Jayson LABOR, DO  pantoprazole  (PROTONIX ) 40 MG tablet Take 1 tablet (40 mg total) by mouth daily. 06/29/22   Molpus, John, MD  predniSONE  (DELTASONE ) 20 MG tablet Take 2 tablets (40 mg total) by mouth daily with breakfast. For the next four days 04/05/18   Garrick Charleston, MD    Allergies: Bee venom, Dicyclomine, and Pollen extract    Review of Systems  Musculoskeletal:  Positive for arthralgias.    Updated Vital Signs BP 136/73 (BP Location: Left Arm)   Pulse 85   Temp 98.2 F (36.8  C)   Resp 20   Ht 5' 5 (1.651 m)   Wt 122.2 kg   SpO2 95%   BMI 44.83 kg/m   Physical Exam Vitals and nursing note reviewed.  Constitutional:      General: She is not in acute distress.    Appearance: She is well-developed.  HENT:     Head: Normocephalic and atraumatic.  Eyes:     Conjunctiva/sclera: Conjunctivae normal.  Cardiovascular:     Pulses: Normal pulses.  Pulmonary:     Effort: Pulmonary effort is normal. No respiratory distress.  Musculoskeletal:        General: Swelling present.     Cervical back: Neck supple.     Comments: Bilateral 1+ pitting edema, worse in left lower extremity, DP/PT pulses 2+, no overlying erythema warmth, tenderness to metatarsal region  Skin:    General: Skin is warm and dry.     Capillary Refill: Capillary refill takes less than 2 seconds.  Neurological:     Mental Status: She is alert.  Psychiatric:        Mood and Affect: Mood normal.     (all labs ordered are listed, but only abnormal results are displayed) Labs Reviewed - No data to display  EKG: None  Radiology: No results found.   Procedures   Medications Ordered in the ED  ketorolac  (TORADOL ) 15 MG/ML injection 15 mg (15 mg Intramuscular Given 03/10/24 1814)    Clinical Course as of 03/10/24 1825  Sun Mar 10, 2024  1806 Patient with 2nd through 4th left metatarsal fractures evaluated for complaints of persistent pain and swelling.  Is planned for surgical treatment in the upcoming week.  She is hemodynamically stable.  On exam she has bilateral lower extremity edema in the setting of lymphedema worse in the left lower extremity in the setting of her fractures.  She has no tenderness to her calf.  Lower suspicion for DVT at this time.  Will provide her a shot of Toradol .  Patient instructed that if she has any worsening pain or swelling that extends up her calf to return to the emergency room for an ultrasound.  I do not have this available today. Although I offered  scheduling an outpatient ultrasound, using shared decision making we agreed that scheduling one at this time is not warranted.  Patient is interested in shot of Toradol .  Will provide this today.  Encourage patient to work with social work and her Careers adviser and scheduling her needed surgery.  Patient is understanding agreement with plan.  Strict return precautions provided including worsening pain swelling in her left lower extremity that extends up her leg, chest pain or shortness of breath. [JT]    Clinical Course User Index [JT] Donnajean Lynwood DEL, PA-C  Medical Decision Making  This patient presents to the ED with chief complaint(s) of foot pain.  The complaint involves an extensive differential diagnosis and also carries with it a high risk of complications and morbidity.   Pertinent past medical history as listed in HPI  The differential diagnosis includes  Considered septic joint, gout, DVT.  However exam and history not consistent with this Additional history obtained: Records reviewed Care Everywhere/External Records  Disposition:   Patient will be discharged home. The patient has been appropriately medically screened and/or stabilized in the ED. I have low suspicion for any other emergent medical condition which would require further screening, evaluation or treatment in the ED or require inpatient management. At time of discharge the patient is hemodynamically stable and in no acute distress. I have discussed work-up results and diagnosis with patient and answered all questions. Patient is agreeable with discharge plan. We discussed strict return precautions for returning to the emergency department and they verbalized understanding.     Social Determinants of Health:   none  This note was dictated with voice recognition software.  Despite best efforts at proofreading, errors may have occurred which can change the documentation meaning.       Final  diagnoses:  Closed displaced fracture of second metatarsal bone of left foot with routine healing, subsequent encounter  Closed nondisplaced fracture of third metatarsal bone of left foot with routine healing, subsequent encounter  Closed nondisplaced fracture of fourth metatarsal bone of left foot with routine healing, subsequent encounter    ED Discharge Orders     None          Donnajean Lynwood DEL, PA-C 03/10/24 1825    Geraldene Hamilton, MD 03/11/24 [phone]

## 2024-03-10 NOTE — ED Notes (Signed)
 Pt did follow up with sports medicine and surgery is recommended.Trying to come a plan for childcare so that she can have surgery.

## 2024-03-10 NOTE — Discharge Instructions (Signed)
 You were evaluated in the emergency room for left foot pain.  Please continue to work with social work and Systems analyst office to schedule surgery.  If you experience any worsening pain or swelling that extends up your calf please return to emergency room for an ultrasound.  Other symptoms to evaluate for would be chest pain or shortness of breath.  I would recommend alternating Tylenol  1000 mg every 4-6 hours up to 4000 mg a day and/or ibuprofen  600-800 mg every 4-6 hours a day up to 3500 mg on full stomach.  You may use oxycodone  for breakthrough pain.  Please try to keep your legs elevated and use the compression stockings as needed.

## 2024-03-12 ENCOUNTER — Other Ambulatory Visit: Payer: Self-pay

## 2024-03-12 ENCOUNTER — Encounter: Payer: Self-pay | Admitting: Internal Medicine

## 2024-03-12 ENCOUNTER — Ambulatory Visit (INDEPENDENT_AMBULATORY_CARE_PROVIDER_SITE_OTHER): Admitting: Internal Medicine

## 2024-03-12 VITALS — BP 126/74 | HR 65 | Temp 98.6°F | Resp 20 | Ht 65.0 in | Wt 273.0 lb

## 2024-03-12 DIAGNOSIS — W57XXXD Bitten or stung by nonvenomous insect and other nonvenomous arthropods, subsequent encounter: Secondary | ICD-10-CM

## 2024-03-12 DIAGNOSIS — J4540 Moderate persistent asthma, uncomplicated: Secondary | ICD-10-CM | POA: Diagnosis not present

## 2024-03-12 DIAGNOSIS — R2231 Localized swelling, mass and lump, right upper limb: Secondary | ICD-10-CM

## 2024-03-12 DIAGNOSIS — L281 Prurigo nodularis: Secondary | ICD-10-CM | POA: Diagnosis not present

## 2024-03-12 DIAGNOSIS — I890 Lymphedema, not elsewhere classified: Secondary | ICD-10-CM

## 2024-03-12 DIAGNOSIS — L2389 Allergic contact dermatitis due to other agents: Secondary | ICD-10-CM

## 2024-03-12 NOTE — Patient Instructions (Addendum)
 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 SABRA Previous treatments with systemic steroids, Claritin , Pepcid , and Xyzal showed limited success. Multiple ED visit  - Continue  clobetasol  0.5% cream, apply twice daily on bites. - continue Xyzal 5mg  to four times daily. - Continue  famotidine  (Pepcid ) 20mg  twice daily  to regimen. - Continue dupixent  300mg  every 2 weeks     Right Hand nodule  - suspect a cyst, please discus with hand surgeon that you already follow with  - Maybe dupixent  uncovered this, but I do not think it caused it and do not recommend stopping medication    Contact Dermatitis  - Avoid using same lubricant for subsequent sleep studies   Moderate Persistent Asthma with OSA, well controlled  - breathing tests looked great!  - Controller Inhaler: Continue Symbicort 160 mcg 2 puffs twice a day; This Should Be Used Everyday - Rinse mouth out after use - Rescue Inhaler: Albuterol  (Proair /Ventolin ) 2 puffs . Use  every 4-6 hours as needed for chest tightness, wheezing, or coughing.  Can also use 15 minutes prior to exercise if you have symptoms with activity. - Asthma is not controlled if:  - Symptoms are occurring >2 times a week OR  - >2 times a month nighttime awakenings  - You are requiring systemic steroids (prednisone /steroid injections) more than once per year  - Your require hospitalization for your asthma.  - Please call the clinic to schedule a follow up if these symptoms arise   Follow up: 6 months   Thank you so much for letting me partake in your care today.  Don't hesitate to reach out if you have any additional concerns!  Hargis Springer, MD  [facility] [place], [place]

## 2024-03-12 NOTE — Progress Notes (Signed)
 FOLLOW UP Date of Service/Encounter:  03/12/24  Subjective:  Sonya Mitchell (DOB: 09-Jul-1965) is a 58 y.o. female who [facility] in re-evaluation of the following: Skeeter syndrome, contact dermatitis, prurigo nodularis History obtained from: chart review and patient.  For Review, LV was on 02/14/24 with Dr. Lorin seen for routine follow-up. See below for summary of history and diagnostics.   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 Discussed the use of AI scribe software for clinical note transcription with the patient, who gave verbal consent to proceed.  History of Present Illness Sonya Mitchell is a 58 year old female who presents with concerns about swelling at the injection site following Dupixent  administration.  Injection site reaction to dupilumab  - Swelling develops ar right hand following each Dupixent  administration - Swelling enlarges and persists despite alternating arms for injections - Swelling is light-sensitive, non-painful, and non-pruritic - Swelling is bothersome to her - Massaging the area does not resolve the swelling - No other side effects from Dupixent  aside from injection site swelling - she follows with hand surgeon and has received cortisone injections in her hand for carpal tunnel syndrome for multiple years.  She is open to discussing nodule with hand surgeon as we suspected might be a cyst.  - Long history of cortisone injections related to her work in the [employer]  Asthma and atopic symptoms - Dupixent  has improved symptoms of itchiness and breathing - Continues to use Symbicort as prescribed, which has further improved breathing - Wants to switch asthma care from pulmonary to us .  - asthma currently well controlled, needs spirometry   Foot fracture - Sustained a foot fracture from a fall - Initial diagnosis of three breaks, later confirmed as seven  fractures - Visited the emergency room twice due to pain  All medications reviewed by clinical staff and updated in chart. No new pertinent medical or surgical history except as noted in HPI.  ROS: All others negative except as noted per HPI.   Objective:  BP 126/74 (BP Location: Left Arm, Patient Position: Sitting, Cuff Size: Large)   Pulse 65   Temp 98.6 F (37 C) (Oral)   Resp 20   Ht 5' 5 (1.651 m)   Wt 273 lb (123.8 kg)   SpO2 100%   BMI 45.43 kg/m  Body mass index is 45.43 kg/m. Physical Exam: General Appearance:  Alert, cooperative, no distress, appears stated age, using scooter and left foot in cast   Head:  Normocephalic, without obvious abnormality, atraumatic  Eyes:  Conjunctiva clear, EOM's intact  Ears EACs normal bilaterally  Nose: Nares normal, no rhinorrhea  Throat: Lips, tongue normal; teeth and gums normal, moist mucous membranes  Neck: Supple, symmetrical  Lungs:   clear to auscultation bilaterally, Respirations unlabored, no coughing  Heart:  regular rate and rhythm and no murmur, Appears well perfused  Extremities: No edema, mobile, firm nodule on ventral aspect of right hand   Skin: Hyperpigmented nodules excoriation marks on arms and Skin color, texture, turgor normal  Neurologic: No gross deficits   Labs:  Lab Orders  No laboratory test(s) ordered today   Spirometry:  Tracings reviewed. Her effort: Good reproducible efforts. FVC: 2.43L FEV1: 2.33L, 100% predicted FEV1/FVC ratio: 0.96% Interpretation: Spirometry consistent with normal pattern.  Please see scanned spirometry results for details.    Assessment/Plan   Patient Instructions  Skeeter syndrome with Prurigo nodularis Recurrent  mosquito bites causing local allergic reactions with PN. SABRA Previous treatments with systemic steroids, Claritin , Pepcid , and Xyzal showed limited success. Multiple ED visit  - Continue  clobetasol  0.5% cream, apply twice daily on bites. - continue Xyzal 5mg   to four times daily. - Continue  famotidine  (Pepcid ) 20mg  twice daily  to regimen. - Continue dupixent  300mg  every 2 weeks     Right Hand nodule  - suspect a cyst, please discus with hand surgeon that you already follow with  - Maybe dupixent  uncovered this, but I do not think it caused it and do not recommend stopping medication    Contact Dermatitis  - Avoid using same lubricant for subsequent sleep studies   Moderate Persistent Asthma with OSA, well controlled  - breathing tests looked great!  - Controller Inhaler: Continue Symbicort 160 mcg 2 puffs twice a day; This Should Be Used Everyday - Rinse mouth out after use - Rescue Inhaler: Albuterol  (Proair /Ventolin ) 2 puffs . Use  every 4-6 hours as needed for chest tightness, wheezing, or coughing.  Can also use 15 minutes prior to exercise if you have symptoms with activity. - Asthma is not controlled if:  - Symptoms are occurring >2 times a week OR  - >2 times a month nighttime awakenings  - You are requiring systemic steroids (prednisone /steroid injections) more than once per year  - Your require hospitalization for your asthma.  - Please call the clinic to schedule a follow up if these symptoms arise   Follow up: 6 months   Thank you so much for letting me partake in your care today.  Don't hesitate to reach out if you have any additional concerns!  Hargis Springer, MD  [facility] [place], [place]  Other:    Thank you so much for letting me partake in your care today.  Don't hesitate to reach out if you have any additional concerns!  Hargis Springer, MD  [facility] [place], [place]

## 2024-03-14 ENCOUNTER — Other Ambulatory Visit: Payer: Self-pay

## 2024-03-14 ENCOUNTER — Other Ambulatory Visit (HOSPITAL_COMMUNITY): Payer: Self-pay

## 2024-03-14 NOTE — Progress Notes (Signed)
 Specialty Pharmacy Refill Coordination Note  Sonya Mitchell is a 58 y.o. female contacted today regarding refills of specialty medication(s) Dupilumab  (DUPIXENT )   Patient requested Courier to Provider Office   Delivery date: 03/18/24   Verified address: [address] [facility]   Medication will be filled on 03/15/24.

## 2024-03-20 ENCOUNTER — Ambulatory Visit (INDEPENDENT_AMBULATORY_CARE_PROVIDER_SITE_OTHER): Payer: Self-pay

## 2024-03-20 ENCOUNTER — Ambulatory Visit

## 2024-03-20 DIAGNOSIS — L281 Prurigo nodularis: Secondary | ICD-10-CM | POA: Diagnosis not present

## 2024-03-22 NOTE — Addendum Note (Signed)
 Addended by: WILLIAMS, Sabastian Raimondi H on: 03/22/2024 03:27 PM   Modules accepted: Orders

## 2024-04-03 ENCOUNTER — Ambulatory Visit (INDEPENDENT_AMBULATORY_CARE_PROVIDER_SITE_OTHER)

## 2024-04-03 DIAGNOSIS — L281 Prurigo nodularis: Secondary | ICD-10-CM | POA: Diagnosis not present

## 2024-04-04 ENCOUNTER — Other Ambulatory Visit: Payer: Self-pay

## 2024-04-10 ENCOUNTER — Other Ambulatory Visit: Payer: Self-pay

## 2024-04-10 ENCOUNTER — Other Ambulatory Visit (HOSPITAL_COMMUNITY): Payer: Self-pay

## 2024-04-10 NOTE — Progress Notes (Signed)
 Specialty Pharmacy Refill Coordination Note  Sonya Mitchell is a 58 y.o. female contacted today regarding refills of specialty medication(s) Dupilumab  (DUPIXENT )   Patient requested Courier to Provider Office   Delivery date: 04/15/24   Verified address: [address] [facility]   Medication will be filled on 10.24.25.    Copay: $4.00- patient aware and plans to pay current AR balance at [facility] on 10.24  Appointment: 10.29.25

## 2024-04-11 ENCOUNTER — Other Ambulatory Visit: Payer: Self-pay

## 2024-04-12 ENCOUNTER — Other Ambulatory Visit (HOSPITAL_COMMUNITY): Payer: Self-pay

## 2024-04-17 ENCOUNTER — Ambulatory Visit

## 2024-04-17 DIAGNOSIS — L281 Prurigo nodularis: Secondary | ICD-10-CM | POA: Diagnosis not present

## 2024-05-01 ENCOUNTER — Ambulatory Visit

## 2024-05-01 DIAGNOSIS — L281 Prurigo nodularis: Secondary | ICD-10-CM

## 2024-05-02 ENCOUNTER — Other Ambulatory Visit: Payer: Self-pay

## 2024-05-07 ENCOUNTER — Other Ambulatory Visit (HOSPITAL_COMMUNITY): Payer: Self-pay

## 2024-05-09 ENCOUNTER — Other Ambulatory Visit (HOSPITAL_COMMUNITY): Payer: Self-pay

## 2024-05-13 ENCOUNTER — Other Ambulatory Visit: Payer: Self-pay

## 2024-05-13 NOTE — Progress Notes (Signed)
 Specialty Pharmacy Refill Coordination Note  Sonya Mitchell is a 58 y.o. female assessed today regarding refills of clinic administered specialty medication(s) Dupilumab  (DUPIXENT )   Clinic requested Courier to Provider Office   Delivery date: 05/14/24   Verified address: [address] [facility]   Medication will be filled on: 05/13/24

## 2024-05-15 ENCOUNTER — Ambulatory Visit: Admitting: Internal Medicine

## 2024-05-15 ENCOUNTER — Encounter: Payer: Self-pay | Admitting: Internal Medicine

## 2024-05-15 ENCOUNTER — Ambulatory Visit (INDEPENDENT_AMBULATORY_CARE_PROVIDER_SITE_OTHER)

## 2024-05-15 VITALS — BP 126/74 | HR 97 | Temp 94.3°F

## 2024-05-15 DIAGNOSIS — L281 Prurigo nodularis: Secondary | ICD-10-CM

## 2024-05-15 DIAGNOSIS — L2389 Allergic contact dermatitis due to other agents: Secondary | ICD-10-CM | POA: Diagnosis not present

## 2024-05-15 DIAGNOSIS — J4540 Moderate persistent asthma, uncomplicated: Secondary | ICD-10-CM | POA: Diagnosis not present

## 2024-05-15 MED ORDER — CLOBETASOL PROPIONATE 0.05 % EX CREA: 1.0000 | TOPICAL_CREAM | Freq: Two times a day (BID) | CUTANEOUS | 3 refills | Status: AC

## 2024-05-15 NOTE — Patient Instructions (Signed)
 Contact Dermatitis with flare on neck  - start clobetasol  0.5% twice daily until rash resolves  - Follow up for patch testing in 4 weeks  - Patches are best placed on Monday with return to office on Wednesday and Friday of same week for readings.  Patches once placed should not get wet.  You do not have to stop any medications for patch testing but should not be on oral prednisone . You can schedule a patch testing visit when convenient for your schedule.     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 SABRA Previous treatments with systemic steroids, Claritin , Pepcid , and Xyzal showed limited success. Multiple ED visit  - Continue  clobetasol  0.5% cream, apply twice daily on bites. - continue Xyzal 5mg  to four times daily. - Continue  famotidine  (Pepcid ) 20mg  twice daily  to regimen. - Continue dupixent  300mg  every 2 weeks   Moderate Persistent Asthma with OSA, well controlled  - breathing tests looked great!  - Controller Inhaler: Continue Symbicort 160 mcg 2 puffs twice a day; This Should Be Used Everyday - Rinse mouth out after use - Rescue Inhaler: Albuterol  (Proair /Ventolin ) 2 puffs . Use  every 4-6 hours as needed for chest tightness, wheezing, or coughing.  Can also use 15 minutes prior to exercise if you have symptoms with activity. - Asthma is not controlled if:  - Symptoms are occurring >2 times a week OR  - >2 times a month nighttime awakenings  - You are requiring systemic steroids (prednisone /steroid injections) more than once per year  - Your require hospitalization for your asthma.  - Please call the clinic to schedule a follow up if these symptoms arise   Follow up: 6 months   Thank you so much for letting me partake in your care today.  Don't hesitate to reach out if you have any additional concerns!  Hargis Springer, MD  [facility] [place], [place]

## 2024-05-15 NOTE — Progress Notes (Signed)
 FOLLOW UP Date of Service/Encounter:  05/15/24  Subjective:  Sonya Mitchell (DOB: [birth year]) is a 58 y.o. female who [facility] in re-evaluation of the following: Skeeter syndrome, contact dermatitis, prurigo nodularis History obtained from: chart review and patient.  For Review, LV was on 03/12/24 with Dr. Lorin seen for routine follow-up. See below for summary of history and diagnostics.   At last visit:  no changes   Presented today for Dupixent  injection and was complaining of rash.  She was walked in for an evaluation.  Today presents for follow-up. Discussed the use of AI scribe software for clinical note transcription with the patient, who gave verbal consent to proceed.  History of Present Illness Sonya Mitchell is a 58 year old female who presents with a rash and dry skin issues.  Cutaneous rash and pruritus - Rash on neck appear to be 3 weeks ago, initially thought it was due to the ropes that she wears around her neck to hold for crystals and vials.  She removed her open rashes persisted.  She has been mixing CeraVe a and triamcinolone for moisturizer and tried this without good response.  She also tried clotrimazole due to history of yeast infection under her breasts as she thought it was similar with no good response.  Xerosis and skin flaking - Diffuse dry skin affecting entire body except buttocks - Skin unable to retain moisture; frequent application of moisturizers required - Skin flakes off despite use of multiple over-the-counter products - Uses compounded mixture of CeraVe and triamcinolone ointment for dry skin; applies frequently - Denied refill of triamcinolone ointment without appointment due to rapid usage - Uses CeraVe moisturizer multiple times daily - Has used Vaseline as a moisturizer; skin dries out quickly after application  Contact allergies and patch testing - Known allergy to perfumes; uses oils  instead - History of patch testing, but last test was performed a long time ago  Overall her reactions to mosquito bites is dramatically improved with Dupixent .  This is her first real flare since starting the injections  All medications reviewed by clinical staff and updated in chart. No new pertinent medical or surgical history except as noted in HPI.  ROS: All others negative except as noted per HPI.   Objective:  BP 126/74 (BP Location: Right Arm, Patient Position: Sitting)   Pulse 97   Temp (!) 94.3 F (34.6 C) (Temporal)   SpO2 100%  There is no height or weight on file to calculate BMI. Physical Exam: General Appearance:  Alert, cooperative, no distress, appears stated age, using scooter and left foot in cast   Head:  Normocephalic, without obvious abnormality, atraumatic  Eyes:  Conjunctiva clear, EOM's intact  Ears EACs normal bilaterally  Nose: Nares normal, no rhinorrhea  Throat: Lips, tongue normal; teeth and gums normal, moist mucous membranes  Neck: Supple, symmetrical  Lungs:   clear to auscultation bilaterally, Respirations unlabored, no coughing  Heart:  regular rate and rhythm and no murmur, Appears well perfused  Extremities: No edema, mobile, firm nodule on ventral aspect of right hand   Skin: Erythematous raised flakey patches on neck  and Skin color, texture, turgor normal  Neurologic: No gross deficits   Labs:  Lab Orders  No laboratory test(s) ordered today     Assessment/Plan   Patient Instructions   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  Patches once placed should not get wet.  You do not have to stop any medications for patch testing but should not be on oral prednisone . You can schedule a patch testing visit when convenient for your  schedule.     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 SABRA Previous treatments with systemic steroids, Claritin , Pepcid , and Xyzal showed limited success. Multiple ED visit  - Continue  clobetasol  0.5% cream, apply twice daily on bites. - continue Xyzal 5mg  to four times daily. - Continue  famotidine  (Pepcid ) 20mg  twice daily  to regimen. - Continue dupixent  300mg  every 2 weeks   Moderate Persistent Asthma with OSA, well controlled  - breathing tests looked great!  - Controller Inhaler: Continue Symbicort 160 mcg 2 puffs twice a day; This Should Be Used Everyday - Rinse mouth out after use - Rescue Inhaler: Albuterol  (Proair /Ventolin ) 2 puffs . Use  every 4-6 hours as needed for chest tightness, wheezing, or coughing.  Can also use 15 minutes prior to exercise if you have symptoms with activity. - Asthma is not controlled if:  - Symptoms are occurring >2 times a week OR  - >2 times a month nighttime awakenings  - You are requiring systemic steroids (prednisone /steroid injections) more than once per year  - Your require hospitalization for your asthma.  - Please call the clinic to schedule a follow up if these symptoms arise   Follow up: 6 months   Thank you so much for letting me partake in your care today.  Don't hesitate to reach out if you have any additional concerns!  Hargis Springer, MD  [facility] [place] [place]  Other:    Thank you so much for letting me partake in your care today.  Don't hesitate to reach out if you have any additional concerns!  Hargis Springer, MD  [facility] [place], [place]

## 2024-05-30 ENCOUNTER — Ambulatory Visit (INDEPENDENT_AMBULATORY_CARE_PROVIDER_SITE_OTHER)

## 2024-05-30 DIAGNOSIS — L281 Prurigo nodularis: Secondary | ICD-10-CM

## 2024-05-31 ENCOUNTER — Other Ambulatory Visit (HOSPITAL_COMMUNITY): Payer: Self-pay

## 2024-06-03 ENCOUNTER — Other Ambulatory Visit: Payer: Self-pay

## 2024-06-03 ENCOUNTER — Other Ambulatory Visit: Payer: Self-pay | Admitting: Pharmacy Technician

## 2024-06-03 NOTE — Progress Notes (Signed)
 Specialty Pharmacy Refill Coordination Note  Sonya Mitchell is a 58 y.o. female assessed today regarding refills of clinic administered specialty medication(s) Dupilumab  (DUPIXENT )   Clinic requested Courier to Provider Office   Delivery date: 06/06/24   Verified address: [facility] [address] [place]   Medication will be filled on: 06/05/24

## 2024-06-05 ENCOUNTER — Other Ambulatory Visit: Payer: Self-pay

## 2024-06-17 ENCOUNTER — Ambulatory Visit

## 2024-06-17 DIAGNOSIS — L281 Prurigo nodularis: Secondary | ICD-10-CM

## 2024-06-26 ENCOUNTER — Other Ambulatory Visit: Payer: Self-pay

## 2024-06-26 ENCOUNTER — Other Ambulatory Visit: Payer: Self-pay | Admitting: Pharmacy Technician

## 2024-06-26 NOTE — Progress Notes (Signed)
 Specialty Pharmacy Refill Coordination Note  Sonya Mitchell is a 59 y.o. female assessed today regarding refills of clinic administered specialty medication(s) Dupilumab  (DUPIXENT )   Clinic requested Courier to Provider Office   Delivery date: 07/01/24   Verified address: [facility] [address] [place]   Medication will be filled on: 07/01/24

## 2024-07-01 ENCOUNTER — Other Ambulatory Visit: Payer: Self-pay

## 2024-07-01 ENCOUNTER — Ambulatory Visit

## 2024-07-04 ENCOUNTER — Ambulatory Visit

## 2024-07-04 DIAGNOSIS — L281 Prurigo nodularis: Secondary | ICD-10-CM | POA: Diagnosis not present

## 2024-07-17 ENCOUNTER — Ambulatory Visit

## 2024-07-17 DIAGNOSIS — L281 Prurigo nodularis: Secondary | ICD-10-CM

## 2024-07-18 ENCOUNTER — Ambulatory Visit

## 2024-07-22 ENCOUNTER — Other Ambulatory Visit: Payer: Self-pay

## 2024-07-22 ENCOUNTER — Other Ambulatory Visit (HOSPITAL_COMMUNITY): Payer: Self-pay

## 2024-07-22 NOTE — Progress Notes (Signed)
 Specialty Pharmacy Refill Coordination Note  In-office administered. Patient/Guardian authorizes monthly copay charge  Sonya Mitchell is a 59 y.o. female contacted today regarding refills of specialty medication(s) Dupilumab  (DUPIXENT )  Injection appointment: 07/31/24  Patient requested: Courier to Provider Office   Delivery date: 07/25/24   Verified address: [id number] [place] [address]  Medication will be filled on 07/24/24 .

## 2024-07-24 ENCOUNTER — Other Ambulatory Visit: Payer: Self-pay

## 2024-07-31 ENCOUNTER — Ambulatory Visit

## 2024-09-10 ENCOUNTER — Ambulatory Visit: Admitting: Internal Medicine
